# Patient Record
Sex: Female | Born: 1995 | Race: White | Hispanic: No | Marital: Married | Smoking: Never smoker
Health system: Southern US, Community
[De-identification: ages and names within clinical notes are randomized; demographics above are authoritative.]

## PROBLEM LIST (undated history)

## (undated) ENCOUNTER — Inpatient Hospital Stay: Payer: Self-pay

## (undated) DIAGNOSIS — N946 Dysmenorrhea, unspecified: Secondary | ICD-10-CM

## (undated) DIAGNOSIS — S2239XA Fracture of one rib, unspecified side, initial encounter for closed fracture: Secondary | ICD-10-CM

## (undated) DIAGNOSIS — Z803 Family history of malignant neoplasm of breast: Secondary | ICD-10-CM

## (undated) HISTORY — PX: OTHER SURGICAL HISTORY: SHX169

## (undated) HISTORY — DX: Dysmenorrhea, unspecified: N94.6

## (undated) HISTORY — DX: Family history of malignant neoplasm of breast: Z80.3

## (undated) HISTORY — PX: DILATION AND CURETTAGE OF UTERUS: SHX78

## (undated) HISTORY — DX: Fracture of one rib, unspecified side, initial encounter for closed fracture: S22.39XA

---
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
## 2016-05-26 ENCOUNTER — Encounter: Payer: Self-pay | Admitting: Certified Nurse Midwife

## 2016-05-27 ENCOUNTER — Ambulatory Visit (INDEPENDENT_AMBULATORY_CARE_PROVIDER_SITE_OTHER): Admitting: Certified Nurse Midwife

## 2016-05-27 ENCOUNTER — Encounter: Payer: Self-pay | Admitting: Certified Nurse Midwife

## 2016-05-27 VITALS — BP 102/72 | HR 85 | Ht 61.0 in | Wt 167.0 lb

## 2016-05-27 DIAGNOSIS — B9689 Other specified bacterial agents as the cause of diseases classified elsewhere: Secondary | ICD-10-CM | POA: Diagnosis not present

## 2016-05-27 DIAGNOSIS — Z124 Encounter for screening for malignant neoplasm of cervix: Secondary | ICD-10-CM | POA: Diagnosis not present

## 2016-05-27 DIAGNOSIS — N760 Acute vaginitis: Secondary | ICD-10-CM

## 2016-05-27 DIAGNOSIS — Z01419 Encounter for gynecological examination (general) (routine) without abnormal findings: Secondary | ICD-10-CM

## 2016-05-27 DIAGNOSIS — N898 Other specified noninflammatory disorders of vagina: Secondary | ICD-10-CM | POA: Diagnosis not present

## 2016-05-27 LAB — POCT WET PREP (WET MOUNT): TRICHOMONAS WET PREP HPF POC: ABSENT

## 2016-05-27 MED ORDER — TINIDAZOLE 500 MG PO TABS: 1000.0000 mg | ORAL_TABLET | Freq: Every day | ORAL | 0 refills | Status: AC

## 2016-05-27 NOTE — Progress Notes (Signed)
Gynecology Annual Exam  PCP: No PCP Per Patient  Chief Complaint:  Chief Complaint  Patient presents with  . Gynecologic Exam    History of Present Illness [name] is a 21 y.o. G1P0010 whose LMP was 05/12/2016 presents today for her annual examination.  Her menses are every 26 days and  last 3-4 days with one heavier day requiring tampon change q2-3 hours. SInce her last annual exam she has married Legrand Como who is in [place]. She will be moving to [place] soon.  She is wanting to conceive and is not currently using any contraception. She is taking a prenatal vitamin.  Last Pap: has not had one due to age   Hx of STDs: none  There is a FH of breast cancer in her mother. Her mother tested negative for BRCA1, BRCA 2 and BART. There is no FH of ovarian cancer. The patient does do self-breast exams occasionally.  Tobacco use: The patient denies current or previous tobacco use. Alcohol use: rare use Exercise: exercises most days of the week  She does not get adequate calcium and Vitamin D in her diet.  .    Review of Systems: Review of Systems  Constitutional: Negative for chills, fever and weight loss.  HENT: Negative for congestion, sinus pain and sore throat.   Eyes: Negative for blurred vision and pain.  Respiratory: Negative for hemoptysis, shortness of breath and wheezing.   Cardiovascular: Negative for chest pain, palpitations and leg swelling.  Gastrointestinal: Negative for abdominal pain, blood in stool, diarrhea, heartburn, nausea and vomiting.  Genitourinary: Negative for dysuria, frequency, hematuria and urgency.  Musculoskeletal: Negative for back pain, joint pain and myalgias.  Skin: Negative for itching and rash.  Neurological: Negative for dizziness, tingling and headaches.  Endo/Heme/Allergies: Negative for environmental allergies and polydipsia. Does not bruise/bleed easily.       Negative for hirsutism   Psychiatric/Behavioral: Negative for  depression. The patient is not nervous/anxious and does not have insomnia.      Past Medical History:  Past Medical History:  Diagnosis Date  . Dysmenorrhea   . Family history of breast cancer    mom age 5; BRCA neg  . Rib fracture 10/2008   sports injury    Past Surgical History:  Past Surgical History:  Procedure Laterality Date  . nexplanon     2015    Medications: Prenatal vitamins  Allergies:  No Known Allergies  Obstetric History: G1P0010  Social History:  Social History   Social History  . Marital status: Married    Spouse name: Legrand Como  . Number of children: 0  . Years of education: N/A   Occupational History  . Not on file.   Social History Main Topics  . Smoking status: Never Smoker  . Smokeless tobacco: Never Used  . Alcohol use Yes  . Drug use: No  . Sexual activity: Yes    Birth control/ protection: None   Other Topics Concern  . Not on file   Social History Narrative  . No narrative on file    Family History:  Family History  Problem Relation Age of Onset  . Breast cancer Mother 14    BRCA neg  . Hypertension Father   . Renal cancer Paternal Grandmother   . Diabetes Paternal Grandfather      Physical Exam Vitals: BP 102/72   Pulse 85   Ht '5\' 1"'$  (1.549 m)   Wt 75.8 kg (167 lb)   LMP 05/12/2016 (  Exact Date)   BMI 31.55 kg/m   Physical Exam  Constitutional: She is oriented to person, place, and time. She appears well-developed and well-nourished.  HENT:  Head: Normocephalic and atraumatic.  Neck: Normal range of motion. Neck supple. No thyromegaly present.  Cardiovascular: Normal rate and regular rhythm.   No murmur heard. Respiratory: Effort normal and breath sounds normal. She has no wheezes. She has no rales. She exhibits no tenderness. Right breast exhibits no inverted nipple, no mass, no nipple discharge, no skin change and no tenderness. Left breast exhibits no inverted nipple, no mass, no nipple discharge, no skin  change and no tenderness.  GI: Soft. She exhibits no distension and no mass. There is no tenderness. There is no rebound and no guarding.  Genitourinary: Rectum normal. Cervix exhibits no motion tenderness, no discharge and no friability. Right adnexum displays no mass and no tenderness. Left adnexum displays no mass and no tenderness.  Genitourinary Comments: Vulva: no lesions or inflammation Vagina: white homogenous discharge with odor Uterus: nonenlarged, normal contour Position: midplane,   mobile, non-tender Adnexa: no masses, NT   Musculoskeletal: Normal range of motion.  Lymphadenopathy:    She has no cervical adenopathy.    She has axillary adenopathy.       Right: No inguinal and no supraclavicular adenopathy present.       Left: No inguinal and no supraclavicular adenopathy present.  No infraclavicular adenopathy  Neurological: She is alert and oriented to person, place, and time.  Skin: Skin is warm and dry. No rash noted.  Psychiatric: She has a normal mood and affect. Her behavior is normal. Judgment and thought content normal.    Wet prep positive for clue cells and amine odor, negative for hyphae and Trich  Assessment: 21 y.o. G1P0010 well woman exam Bacterial vaginosis  Plan:   1. Encounter for gynecological examination   2. Screening for cervical cancer Pap done  3. Bacterial vaginosis  Rx for Tindamax 1 GM daily x 5 days. Advised to alcohol x 7 days. Take with food. 4. Desires to conceive.   Continue on prenatal vitamins  No FH of genetic problems or congenital abnormalities  Reviewed when most fertile    Dalia Heading, [place]  05/27/2016 9:44 PM

## 2016-06-01 LAB — IGP,RFX APTIMA HPV ALL PTH: PAP Smear Comment: 0

## 2016-06-11 ENCOUNTER — Encounter: Payer: Self-pay | Admitting: Obstetrics and Gynecology

## 2018-01-11 ENCOUNTER — Ambulatory Visit (INDEPENDENT_AMBULATORY_CARE_PROVIDER_SITE_OTHER): Payer: BLUE CROSS/BLUE SHIELD | Admitting: Advanced Practice Midwife

## 2018-01-11 ENCOUNTER — Encounter: Payer: Self-pay | Admitting: Advanced Practice Midwife

## 2018-01-11 ENCOUNTER — Ambulatory Visit (INDEPENDENT_AMBULATORY_CARE_PROVIDER_SITE_OTHER): Payer: BLUE CROSS/BLUE SHIELD

## 2018-01-11 VITALS — BP 120/70 | Wt 151.0 lb

## 2018-01-11 DIAGNOSIS — Z3480 Encounter for supervision of other normal pregnancy, unspecified trimester: Secondary | ICD-10-CM

## 2018-01-11 DIAGNOSIS — Z131 Encounter for screening for diabetes mellitus: Secondary | ICD-10-CM

## 2018-01-11 DIAGNOSIS — Z23 Encounter for immunization: Secondary | ICD-10-CM

## 2018-01-11 DIAGNOSIS — Z113 Encounter for screening for infections with a predominantly sexual mode of transmission: Secondary | ICD-10-CM

## 2018-01-11 DIAGNOSIS — Z363 Encounter for antenatal screening for malformations: Secondary | ICD-10-CM | POA: Diagnosis not present

## 2018-01-11 DIAGNOSIS — Z3482 Encounter for supervision of other normal pregnancy, second trimester: Secondary | ICD-10-CM

## 2018-01-11 DIAGNOSIS — Z130 Encounter for screening for diseases of the blood and blood-forming organs and certain disorders involving the immune mechanism: Secondary | ICD-10-CM

## 2018-01-11 DIAGNOSIS — Z3A23 23 weeks gestation of pregnancy: Secondary | ICD-10-CM

## 2018-01-11 NOTE — Patient Instructions (Signed)
Exercise During Pregnancy For people of all ages, exercise is an important part of being healthy. Exercise improves heart and lung function and helps to maintain strength, flexibility, and a healthy body weight. Exercise also boosts energy levels and elevates mood. For most women, maintaining an exercise routine throughout pregnancy is recommended. It is only on rare occasions and with certain medical conditions or pregnancy complications that women may be asked to limit or avoid exercise during pregnancy. What are some other benefits to exercising during pregnancy? Along with maintaining strength and flexibility, exercising throughout pregnancy can help to:  Keep strength in muscles that are very important during labor and childbirth.  Decrease low back pain during pregnancy.  Decrease the risk of developing gestational diabetes mellitus (GDM).  Improve blood sugar (glucose) control for women who have GDM.  Decrease the risk of developing preeclampsia. This is a serious condition that causes high blood pressure along with other symptoms, such as swelling and headaches.  Decrease the risk of cesarean delivery.  Speed up the recovery after giving birth.  How often should I exercise? Unless your health care provider gives you different instructions, you should try to exercise on most days or all days of the week. In general, try to exercise with moderate intensity for about 150 minutes per week. This can be spread out across several days, such as exercising for 30 minutes per day on 5 days of each week. You can tell that you are exercising at a moderate intensity if you have a higher heart rate and faster breathing, but you are still able to hold a conversation. What types of moderate-intensity exercise are recommended during pregnancy? There are many types of exercise that are safe for you to do during pregnancy. Unless your health care provider gives you different instructions, do a variety of  exercises that safely increase your heart and breathing (cardiopulmonary) rates and help you to build and maintain muscle strength (strength training). You should always be able to talk in full sentences while exercising during pregnancy. Some examples of exercising that is safe to do during pregnancy include:  Brisk walking or hiking.  Swimming.  Water aerobics.  Riding a stationary bike.  Strength training.  Modified yoga or Pilates. Tell your instructor that you are pregnant. Avoid overstretching and avoid lying on your back for long periods of time.  Running or jogging. Only choose this type of exercise if: ? You ran or jogged regularly before your pregnancy. ? You can run or jog and still talk in complete sentences.  What types of exercise should I not do during pregnancy? Depending on your level of fitness and whether you exercised regularly before your pregnancy, you may be advised to limit vigorous-intensity exercise during your pregnancy. You can tell that you are exercising at a vigorous intensity if you are breathing much harder and faster and cannot hold a conversation while exercising. Some examples of exercising that you should avoid during pregnancy include:  Contact sports.  Activities that place you at risk for falling on or being hit in the belly, such as downhill skiing, water skiing, surfing, rock climbing, cycling, gymnastics, and horseback riding.  Scuba diving.  Sky diving.  Yoga or Pilates in a room that is heated to extreme temperatures ("hot yoga" or "hot Pilates").  Jogging or running, unless you ran or jogged regularly before your pregnancy. While jogging or running, you should always be able to talk in full sentences. Do not run or jog so vigorously   that you are unable to have a conversation.  If you are not used to exercising at elevation (more than 6,000 feet above sea level), do not do so during your pregnancy.  When should I avoid exercising  during pregnancy? Certain medical conditions can make it unsafe to exercise during pregnancy, or they may increase your risk of miscarriage or early labor and birth. Some of these conditions include:  Some types of heart disease.  Some types of lung disease.  Placenta previa. This is when the placenta partially or completely covers the opening of the uterus (cervix).  Frequent bleeding from the vagina during your pregnancy.  Incompetent cervix. This is when your cervix does not remain as tightly closed during pregnancy as it should.  Premature labor.  Ruptured membranes. This is when the protective sac (amniotic sac) opens up and amniotic fluid leaks from your vagina.  Severely low blood count (anemia).  Preeclampsia or pregnancy-caused high blood pressure.  Carrying more than one baby (multiple gestation) and having an additional risk of early labor.  Poorly controlled diabetes.  Being severely underweight or severely overweight.  Intrauterine growth restriction. This is when your baby's growth and development during pregnancy are slower than expected.  Other medical conditions. Ask your health care provider if any apply to you.  What else should I know about exercising during pregnancy? You should take these precautions while exercising during pregnancy:  Avoid overheating. ? Wear loose-fitting, breathable clothes. ? Do not exercise in very high temperatures.  Avoid dehydration. Drink enough water before, during, and after exercise to keep your urine clear or pale yellow.  Avoid overstretching. Because of hormone changes during pregnancy, it is easy to overstretch muscles, tendons, and ligaments during pregnancy.  Start slowly and ask your health care provider to recommend types of exercise that are safe for you, if exercising regularly is new for you.  Pregnancy is not a time for exercising to lose weight. When should I seek medical care? You should stop exercising  and call your health care provider if you have any unusual symptoms, such as:  Mild uterine contractions or abdominal cramping.  Dizziness that does not improve with rest.  When should I seek immediate medical care? You should stop exercising and call your local emergency services (911 in the [place] if you have any unusual symptoms, such as:  Sudden, severe pain in your low back or your belly.  Uterine contractions or abdominal cramping that do not improve with rest.  Chest pain.  Bleeding or fluid leaking from your vagina.  Shortness of breath.  This information is not intended to replace advice given to you by your health care provider. Make sure you discuss any questions you have with your health care provider. Document Released: 02/09/2005 Document Revised: 07/10/2015 Document Reviewed: 04/19/2014 [employer] Interactive Patient Education  2018 [employer] Eating Plan for Pregnant Women While you are pregnant, your body will require additional nutrition to help support your growing baby. It is recommended that you consume:  150 additional calories each day during your first trimester.  300 additional calories each day during your second trimester.  300 additional calories each day during your third trimester.  Eating a healthy, well-balanced diet is very important for your health and for your baby's health. You also have a higher need for some vitamins and minerals, such as folic acid, calcium, iron, and vitamin D. What do I need to know about eating during pregnancy?  Do not try to lose weight   or go on a diet during pregnancy.  Choose healthy, nutritious foods. Choose  of a sandwich with a glass of milk instead of a candy bar or a high-calorie sugar-sweetened beverage.  Limit your overall intake of foods that have "empty calories." These are foods that have little nutritional value, such as sweets, desserts, candies, sugar-sweetened beverages, and fried foods.  Eat a  variety of foods, especially fruits and vegetables.  Take a prenatal vitamin to help meet the additional needs during pregnancy, specifically for folic acid, iron, calcium, and vitamin D.  Remember to stay active. Ask your health care provider for exercise recommendations that are specific to you.  Practice good food safety and cleanliness, such as washing your hands before you eat and after you prepare raw meat. This helps to prevent foodborne illnesses, such as listeriosis, that can be very dangerous for your baby. Ask your health care provider for more information about listeriosis. What does 150 extra calories look like? Healthy options for an additional 150 calories each day could be any of the following:  Plain low-fat yogurt (6-8 oz) with  cup of berries.  1 apple with 2 teaspoons of peanut butter.  Cut-up vegetables with  cup of hummus.  Low-fat chocolate milk (8 oz or 1 cup).  1 string cheese with 1 medium orange.   of a peanut butter and jelly sandwich on whole-wheat bread (1 tsp of peanut butter).  For 300 calories, you could eat two of those healthy options each day. What is a healthy amount of weight to gain? The recommended amount of weight for you to gain is based on your pre-pregnancy BMI. If your pre-pregnancy BMI was:  Less than 18 (underweight), you should gain 28-40 lb.  18-24.9 (normal), you should gain 25-35 lb.  25-29.9 (overweight), you should gain 15-25 lb.  Greater than 30 (obese), you should gain 11-20 lb.  What if I am having twins or multiples? Generally, pregnant women who will be having twins or multiples may need to increase their daily calories by 300-600 calories each day. The recommended range for total weight gain is 25-54 lb, depending on your pre-pregnancy BMI. Talk with your health care provider for specific guidance about additional nutritional needs, weight gain, and exercise during your pregnancy. What foods can I eat? Grains Any  grains. Try to choose whole grains, such as whole-wheat bread, oatmeal, or brown rice. Vegetables Any vegetables. Try to eat a variety of colors and types of vegetables to get a full range of vitamins and minerals. Remember to wash your vegetables well before eating. Fruits Any fruits. Try to eat a variety of colors and types of fruit to get a full range of vitamins and minerals. Remember to wash your fruits well before eating. Meats and Other Protein Sources Lean meats, including chicken, turkey, fish, and lean cuts of beef, veal, or pork. Make sure that all meats are cooked to "well done." Tofu. Tempeh. Beans. Eggs. Peanut butter and other nut butters. Seafood, such as shrimp, crab, and lobster. If you choose fish, select types that are higher in omega-3 fatty acids, including salmon, herring, mussels, trout, sardines, and pollock. Make sure that all meats are cooked to food-safe temperatures. Dairy Pasteurized milk and milk alternatives. Pasteurized yogurt and pasteurized cheese. Cottage cheese. Sour cream. Beverages Water. Juices that contain 100% fruit juice or vegetable juice. Caffeine-free teas and decaffeinated coffee. Drinks that contain caffeine are okay to drink, but it is better to avoid caffeine. Keep your total caffeine   intake to less than 200 mg each day (12 oz of coffee, tea, or soda) or as directed by your health care provider. Condiments Any pasteurized condiments. Sweets and Desserts Any sweets and desserts. Fats and Oils Any fats and oils. The items listed above may not be a complete list of recommended foods or beverages. Contact your dietitian for more options. What foods are not recommended? Vegetables Unpasteurized (raw) vegetable juices. Fruits Unpasteurized (raw) fruit juices. Meats and Other Protein Sources Cured meats that have nitrates, such as bacon, salami, and hotdogs. Luncheon meats, bologna, or other deli meats (unless they are reheated until they are  steaming hot). Refrigerated pate, meat spreads from a meat counter, smoked seafood that is found in the refrigerated section of a store. Raw fish, such as sushi or sashimi. High mercury content fish, such as tilefish, shark, swordfish, and king mackerel. Raw meats, such as tuna or beef tartare. Undercooked meats and poultry. Make sure that all meats are cooked to food-safe temperatures. Dairy Unpasteurized (raw) milk and any foods that have raw milk in them. Soft cheeses, such as feta, queso blanco, queso fresco, Brie, Camembert cheeses, blue-veined cheeses, and Panela cheese (unless it is made with pasteurized milk, which must be stated on the label). Beverages Alcohol. Sugar-sweetened beverages, such as sodas, teas, or energy drinks. Condiments Homemade fermented foods and drinks, such as pickles, sauerkraut, or kombucha drinks. (Store-bought pasteurized versions of these are okay.) Other Salads that are made in the store, such as ham salad, chicken salad, egg salad, tuna salad, and seafood salad. The items listed above may not be a complete list of foods and beverages to avoid. Contact your dietitian for more information. This information is not intended to replace advice given to you by your health care provider. Make sure you discuss any questions you have with your health care provider. Document Released: 11/24/2013 Document Revised: 07/18/2015 Document Reviewed: 07/25/2013 [employer] Interactive Patient Education  2018 [employer] Prenatal Care WHAT IS PRENATAL CARE? Prenatal care is the process of caring for a pregnant woman before she gives birth. Prenatal care makes sure that she and her baby remain as healthy as possible throughout pregnancy. Prenatal care may be provided by a midwife, family practice health care provider, or a childbirth and pregnancy specialist (obstetrician). Prenatal care may include physical examinations, testing, treatments, and education on nutrition, lifestyle, and  social support services. WHY IS PRENATAL CARE SO IMPORTANT? Early and consistent prenatal care increases the chance that you and your baby will remain healthy throughout your pregnancy. This type of care also decreases a baby's risk of being born too early (prematurely), or being born smaller than expected (small for gestational age). Any underlying medical conditions you may have that could pose a risk during your pregnancy are discussed during prenatal care visits. You will also be monitored regularly for any new conditions that may arise during your pregnancy so they can be treated quickly and effectively. WHAT HAPPENS DURING PRENATAL CARE VISITS? Prenatal care visits may include the following: Discussion Tell your health care provider about any new signs or symptoms you have experienced since your last visit. These might include:  Nausea or vomiting.  Increased or decreased level of energy.  Difficulty sleeping.  Back or leg pain.  Weight changes.  Frequent urination.  Shortness of breath with physical activity.  Changes in your skin, such as the development of a rash or itchiness.  Vaginal discharge or bleeding.  Feelings of excitement or nervousness.  Changes in   your baby's movements.  You may want to write down any questions or topics you want to discuss with your health care provider and bring them with you to your appointment. Examination During your first prenatal care visit, you will likely have a complete physical exam. Your health care provider will often examine your vagina, cervix, and the position of your uterus, as well as check your heart, lungs, and other body systems. As your pregnancy progresses, your health care provider will measure the size of your uterus and your baby's position inside your uterus. He or she may also examine you for early signs of labor. Your prenatal visits may also include checking your blood pressure and, after about 10-12 weeks of  pregnancy, listening to your baby's heartbeat. Testing Regular testing often includes:  Urinalysis. This checks your urine for glucose, protein, or signs of infection.  Blood count. This checks the levels of white and red blood cells in your body.  Tests for sexually transmitted infections (STIs). Testing for STIs at the beginning of pregnancy is routinely done and is required in many states.  Antibody testing. You will be checked to see if you are immune to certain illnesses, such as rubella, that can affect a developing fetus.  Glucose screen. Around 24-28 weeks of pregnancy, your blood glucose level will be checked for signs of gestational diabetes. Follow-up tests may be recommended.  Group B strep. This is a bacteria that is commonly found inside a woman's vagina. This test will inform your health care provider if you need an antibiotic to reduce the amount of this bacteria in your body prior to labor and childbirth.  Ultrasound. Many pregnant women undergo an ultrasound screening around 18-20 weeks of pregnancy to evaluate the health of the fetus and check for any developmental abnormalities.  HIV (human immunodeficiency virus) testing. Early in your pregnancy, you will be screened for HIV. If you are at high risk for HIV, this test may be repeated during your third trimester of pregnancy.  You may be offered other testing based on your age, personal or family medical history, or other factors. HOW OFTEN SHOULD I PLAN TO SEE MY HEALTH CARE PROVIDER FOR PRENATAL CARE? Your prenatal care check-up schedule depends on any medical conditions you have before, or develop during, your pregnancy. If you do not have any underlying medical conditions, you will likely be seen for checkups:  Monthly, during the first 6 months of pregnancy.  Twice a month during months 7 and 8 of pregnancy.  Weekly starting in the 9th month of pregnancy and until delivery.  If you develop signs of early labor  or other concerning signs or symptoms, you may need to see your health care provider more often. Ask your health care provider what prenatal care schedule is best for you. WHAT CAN I DO TO KEEP MYSELF AND MY BABY AS HEALTHY AS POSSIBLE DURING MY PREGNANCY?  Take a prenatal vitamin containing 400 micrograms (0.4 mg) of folic acid every day. Your health care provider may also ask you to take additional vitamins such as iodine, vitamin D, iron, copper, and zinc.  Take 1500-2000 mg of calcium daily starting at your 20th week of pregnancy until you deliver your baby.  Make sure you are up to date on your vaccinations. Unless directed otherwise by your health care provider: ? You should receive a tetanus, diphtheria, and pertussis (Tdap) vaccination between the 27th and 36th week of your pregnancy, regardless of when your last Tdap immunization   occurred. This helps protect your baby from whooping cough (pertussis) after he or she is born. ? You should receive an annual inactivated influenza vaccine (IIV) to help protect you and your baby from influenza. This can be done at any point during your pregnancy.  Eat a well-rounded diet that includes: ? Fresh fruits and vegetables. ? Lean proteins. ? Calcium-rich foods such as milk, yogurt, hard cheeses, and dark, leafy greens. ? Whole grain breads.  Do noteat seafood high in mercury, including: ? Swordfish. ? Tilefish. ? Shark. ? King mackerel. ? More than 6 oz tuna per week.  Do not eat: ? Raw or undercooked meats or eggs. ? Unpasteurized foods, such as soft cheeses (brie, blue, or feta), juices, and milks. ? Lunch meats. ? Hot dogs that have not been heated until they are steaming.  Drink enough water to keep your urine clear or pale yellow. For many women, this may be 10 or more 8 oz glasses of water each day. Keeping yourself hydrated helps deliver nutrients to your baby and may prevent the start of pre-term uterine contractions.  Do not use  any tobacco products including cigarettes, chewing tobacco, or electronic cigarettes. If you need help quitting, ask your health care provider.  Do not drink beverages containing alcohol. No safe level of alcohol consumption during pregnancy has been determined.  Do not use any illegal drugs. These can harm your developing baby or cause a miscarriage.  Ask your health care provider or pharmacist before taking any prescription or over-the-counter medicines, herbs, or supplements.  Limit your caffeine intake to no more than 200 mg per day.  Exercise. Unless told otherwise by your health care provider, try to get 30 minutes of moderate exercise most days of the week. Do not  do high-impact activities, contact sports, or activities with a high risk of falling, such as horseback riding or downhill skiing.  Get plenty of rest.  Avoid anything that raises your body temperature, such as hot tubs and saunas.  If you own a cat, do not empty its litter box. Bacteria contained in cat feces can cause an infection called toxoplasmosis. This can result in serious harm to the fetus.  Stay away from chemicals such as insecticides, lead, mercury, and cleaning or paint products that contain solvents.  Do not have any X-rays taken unless medically necessary.  Take a childbirth and breastfeeding preparation class. Ask your health care provider if you need a referral or recommendation.  This information is not intended to replace advice given to you by your health care provider. Make sure you discuss any questions you have with your health care provider. Document Released: 02/12/2003 Document Revised: 07/15/2015 Document Reviewed: 04/26/2013 [employer] Interactive Patient Education  2017 [employer]  

## 2018-01-11 NOTE — Progress Notes (Addendum)
New Obstetric Patient H&P    Chief Complaint: Transfer of prenatal care from [facility] [address] [place], [place]   History of Present Illness: Patient is a 22 y.o. [id number] Not Hispanic or Latino female, presents with amenorrhea and positive home pregnancy test. Patient's last menstrual period was 07/28/2017 (exact date). and based on her LMP, her EDD is Estimated Date of Delivery: 05/04/18 and her EGA is [redacted]w[redacted]d Cycles are 4. days, regular, and occur approximately every : 28 days. Her last pap smear was 3 months ago and was no abnormalities.    She had a urine pregnancy test which was positive 4 month(s)  ago. Her last menstrual period was normal and lasted for  3 or 4 day(s). Since her LMP she claims she has experienced breast tenderness. She denies vaginal bleeding. Her past medical history is noncontributory. Her prior pregnancies are notable for TAB, SAB  Since her LMP, she admits to the use of tobacco products  no She claims she initially lost 15 pounds since the start of her pregnancy and then regained to within 6 pounds of her pre-pregnancy weight.  There are cats in the home in the home  no  She admits close contact with children on a regular basis  no  She has had chicken pox in the past yes She has had Tuberculosis exposures, symptoms, or previously tested positive for TB   no Current or past history of domestic violence. no  Genetic Screening/Teratology Counseling: (Includes patient, baby's father, or anyone in either family with:)   [facility] 2. Thalassemia ([place] or Asian background): MCV<80  no 3. Neural tube defect (meningomyelocele, spina bifida, anencephaly)  no 4. Congenital heart defect  no  5. Down syndrome  Mom's cousin with DS 6. Tay-Sachs (Jewish, [place]  no 7. Canavan's Disease  no 8. Sickle cell disease or trait (African)  no  9. Hemophilia or other blood disorders  no  10. Muscular dystrophy  no  11. Cystic  fibrosis  no  12. Huntington's Chorea  no  13. Mental retardation/autism  no 14. Other inherited genetic or chromosomal disorder  no 15. Maternal metabolic disorder (DM, PKU, etc)  no 16. Patient or FOB with a child with a birth defect not listed above no  16a. Patient or FOB with a birth defect themselves no 17. Recurrent pregnancy loss, or stillbirth  no  18. Any medications since LMP other than prenatal vitamins (include vitamins, supplements, OTC meds, drugs, alcohol)  no 19. Any other genetic/environmental exposure to discuss  no  Infection History:   1. Lives with someone with TB or TB exposed  no  2. Patient or partner has history of genital herpes  no 3. Rash or viral illness since LMP  no 4. History of STI (GC, CT, HPV, syphilis, HIV)  no 5. History of recent travel :  no  Other pertinent information: Has not had anatomy scan yet due to traveling/moving and will have anatomy scan today    Review of Systems:10 point review of systems negative unless otherwise noted in HPI  Past Medical History:  Past Medical History:  Diagnosis Date  . Dysmenorrhea   . Family history of breast cancer    mom age 23 BRCA neg; pt qualifies for update testing when older  . Rib fracture 10/2008   sports injury    Past Surgical History:  Past Surgical History:  Procedure Laterality Date  . DILATION AND  CURETTAGE OF UTERUS    . nexplanon     2015    Gynecologic History: Patient's last menstrual period was 07/28/2017 (exact date).  Obstetric History: G3P0020  Family History:  Family History  Problem Relation Age of Onset  . Breast cancer Mother 38       BRCA neg  . Hypertension Father   . Kidney Stones Father   . Renal cancer Paternal Grandmother   . Diabetes Paternal Grandfather   . Stroke Maternal Grandfather     Social History:  Social History   Socioeconomic History  . Marital status: Married    Spouse name: Legrand Como  . Number of children: 0  . Years of education:  Not on file  . Highest education level: Not on file  Occupational History  . Not on file  Social Needs  . Financial resource strain: Not on file  . Food insecurity:    Worry: Not on file    Inability: Not on file  . Transportation needs:    Medical: Not on file    Non-medical: Not on file  Tobacco Use  . Smoking status: Never Smoker  . Smokeless tobacco: Never Used  Substance and Sexual Activity  . Alcohol use: Yes  . Drug use: No  . Sexual activity: Yes    Birth control/protection: None  Lifestyle  . Physical activity:    Days per week: Not on file    Minutes per session: Not on file  . Stress: Not on file  Relationships  . Social connections:    Talks on phone: Not on file    Gets together: Not on file    Attends religious service: Not on file    Active member of club or organization: Not on file    Attends meetings of clubs or organizations: Not on file    Relationship status: Not on file  . Intimate partner violence:    Fear of current or ex partner: Not on file    Emotionally abused: Not on file    Physically abused: Not on file    Forced sexual activity: Not on file  Other Topics Concern  . Not on file  Social History Narrative  . Not on file    Allergies:  No Known Allergies  Medications: Prior to Admission medications   Medication Sig Start Date End Date Taking? Authorizing Provider  Prenatal Vit-Fe Fumarate-FA (MULTIVITAMIN-PRENATAL) 27-0.8 MG TABS tablet Take 1 tablet by mouth daily at 12 noon.   Yes [provider]    Physical Exam Vitals: Blood pressure 120/70, weight 151 lb (68.5 kg), last menstrual period 07/28/2017.  General: NAD HEENT: normocephalic, anicteric Thyroid: no enlargement, no palpable nodules Pulmonary: No increased work of breathing, CTAB Cardiovascular: RRR, distal pulses 2+ Abdomen: NABS, soft, non-tender, non-distended.  Umbilicus without lesions.  No hepatomegaly, splenomegaly or masses palpable. No evidence of  hernia. FHTs 136  Genitourinary: deferred for no concerns, PAP interval Extremities: no edema, erythema, or tenderness Neurologic: Grossly intact Psychiatric: mood appropriate, affect full  Anatomy scan is complete and normal/female   Assessment: 22 y.o. G3P0020 at 77w6dpresenting to initiate prenatal care  Plan: 1) Avoid alcoholic beverages. 2) Patient encouraged not to smoke.  3) Discontinue the use of all non-medicinal drugs and chemicals.  4) Take prenatal vitamins daily.  5) Nutrition, food safety (fish, cheese advisories, and high nitrite foods) and exercise discussed. 6) Hospital and practice style discussed with cross coverage system.  7) Genetic Screening, such as with 1st  Trimester Screening, cell free fetal DNA, AFP testing, and Ultrasound, as well as with amniocentesis and CVS as appropriate, is discussed with patient. She has already had genetic screening 8) Patient is asked about travel to areas at risk for the [place] virus, and counseled to avoid travel and exposure to mosquitoes or sexual partners who may have themselves been exposed to the virus. Testing is discussed, and will be ordered as appropriate.  9) Anatomy scan later this afternoon 10) Return in 4 weeks for 28 week labs and rob   Rod Can, [facility] 01/11/2018, 2:27 PM

## 2018-02-07 ENCOUNTER — Other Ambulatory Visit: Payer: Self-pay

## 2018-02-07 ENCOUNTER — Observation Stay
Admission: EM | Admit: 2018-02-07 | Discharge: 2018-02-07 | Disposition: A | Discharge status: Home / Self Care | Payer: Medicaid Other | Attending: Obstetrics & Gynecology | Admitting: Obstetrics & Gynecology

## 2018-02-07 DIAGNOSIS — Z3A27 27 weeks gestation of pregnancy: Secondary | ICD-10-CM | POA: Diagnosis not present

## 2018-02-07 DIAGNOSIS — O212 Late vomiting of pregnancy: Principal | ICD-10-CM | POA: Insufficient documentation

## 2018-02-07 DIAGNOSIS — O219 Vomiting of pregnancy, unspecified: Secondary | ICD-10-CM | POA: Diagnosis present

## 2018-02-07 MED ORDER — PROMETHAZINE HCL 25 MG PO TABS
25.0000 mg | ORAL_TABLET | Freq: Once | ORAL | Status: AC
  Administered 2018-02-07: 25 mg via ORAL
  Filled 2018-02-07: qty 1

## 2018-02-07 MED ORDER — PROMETHAZINE HCL 25 MG PO TABS: 25.0000 mg | ORAL_TABLET | Freq: Four times a day (QID) | ORAL | 2 refills | Status: DC | PRN

## 2018-02-07 NOTE — OB Triage Note (Signed)
Pt arrived to unit with c/o Emesis since last night 02/06/18. Last emesis was around 1230. Currently able to hold down a water, sprite, crackers. Pt states she feels "flush" and wants to be "checked out". Denies bleeding, LOF, or contractions. +FM. No other concerns.

## 2018-02-07 NOTE — Discharge Instructions (Signed)
Promethazine tablets What is this medicine? PROMETHAZINE (proe METH a zeen) is an antihistamine. It is used to treat allergic reactions and to treat or prevent nausea and vomiting from illness or motion sickness. It is also used to make you sleep before surgery, and to help treat pain or nausea after surgery. This medicine may be used for other purposes; ask your health care provider or pharmacist if you have questions. COMMON BRAND NAME(S): Phenergan What should I tell my health care provider before I take this medicine? They need to know if you have any of these conditions: -glaucoma -high blood pressure or heart disease -kidney disease -liver disease -lung or breathing disease, like asthma -prostate trouble -pain or difficulty passing urine -seizures -an unusual or allergic reaction to promethazine or phenothiazines, other medicines, foods, dyes, or preservatives -pregnant or trying to get pregnant -breast-feeding How should I use this medicine? Take this medicine by mouth with a glass of water. Follow the directions on the prescription label. Take your doses at regular intervals. Do not take your medicine more often than directed. Talk to your pediatrician regarding the use of this medicine in children. Special care may be needed. This medicine should not be given to infants and children younger than 2 years old. Overdosage: If you think you have taken too much of this medicine contact a poison control center or emergency room at once. NOTE: This medicine is only for you. Do not share this medicine with others. What if I miss a dose? If you miss a dose, take it as soon as you can. If it is almost time for your next dose, take only that dose. Do not take double or extra doses. What may interact with this medicine? Do not take this medicine with any of the following medications: -cisapride -dofetilide -dronedarone -MAOIs like Carbex, Eldepryl, Marplan, Nardil,  Parnate -pimozide -quinidine, including dextromethorphan; quinidine -thioridazine -ziprasidone This medicine may also interact with the following medications: -certain medicines for depression, anxiety, or psychotic disturbances -certain medicines for anxiety or sleep -certain medicines for seizures like carbamazepine, phenobarbital, phenytoin -certain medicines for movement abnormalities as in Parkinson's disease, or for gastrointestinal problems -epinephrine -medicines for allergies or colds -muscle relaxants -narcotic medicines for pain -other medicines that prolong the QT interval (cause an abnormal heart rhythm) -tramadol -trimethobenzamide This list may not describe all possible interactions. Give your health care provider a list of all the medicines, herbs, non-prescription drugs, or dietary supplements you use. Also tell them if you smoke, drink alcohol, or use illegal drugs. Some items may interact with your medicine. What should I watch for while using this medicine? Tell your doctor or health care professional if your symptoms do not start to get better in 1 to 2 days. You may get drowsy or dizzy. Do not drive, use machinery, or do anything that needs mental alertness until you know how this medicine affects you. To reduce the risk of dizzy or fainting spells, do not stand or sit up quickly, especially if you are an older patient. Alcohol may increase dizziness and drowsiness. Avoid alcoholic drinks. Your mouth may get dry. Chewing sugarless gum or sucking hard candy, and drinking plenty of water may help. Contact your doctor if the problem does not go away or is severe. This medicine may cause dry eyes and blurred vision. If you wear contact lenses you may feel some discomfort. Lubricating drops may help. See your eye doctor if the problem does not go away or is severe. This   medicine can make you more sensitive to the sun. Keep out of the sun. If you cannot avoid being in the sun,  wear protective clothing and use sunscreen. Do not use sun lamps or tanning beds/booths. If you are diabetic, check your blood-sugar levels regularly. What side effects may I notice from receiving this medicine? Side effects that you should report to your doctor or health care professional as soon as possible: -blurred vision -irregular heartbeat, palpitations or chest pain -muscle or facial twitches -pain or difficulty passing urine -seizures -skin rash -slowed or shallow breathing -unusual bleeding or bruising -yellowing of the eyes or skin Side effects that usually do not require medical attention (report to your doctor or health care professional if they continue or are bothersome): -headache -nightmares, agitation, nervousness, excitability, not able to sleep (these are more likely in children) -stuffy nose This list may not describe all possible side effects. Call your doctor for medical advice about side effects. You may report side effects to FDA at 1-800-FDA-1088. Where should I keep my medicine? Keep out of the reach of children. Store at room temperature, between 20 and 25 degrees C (68 and 77 degrees F). Protect from light. Throw away any unused medicine after the expiration date. NOTE: This sheet is a summary. It may not cover all possible information. If you have questions about this medicine, talk to your doctor, pharmacist, or health care provider.  2018 Elsevier/Gold Standard (2012-10-11 15:04:46)  

## 2018-02-08 ENCOUNTER — Encounter: Payer: BLUE CROSS/BLUE SHIELD | Admitting: Obstetrics & Gynecology

## 2018-02-08 ENCOUNTER — Other Ambulatory Visit: Payer: BLUE CROSS/BLUE SHIELD

## 2018-02-08 NOTE — Discharge Summary (Signed)
See Final Progress Note 02/07/2018.

## 2018-02-08 NOTE — Final Progress Note (Signed)
Physician Final Progress Note  Patient ID: Taylor Lucas MRN: [id number] DOB/AGE: 1995-12-23 22 y.o.  Admit date: 02/07/2018 Admitting provider: Gae Dry, MD Discharge date: 02/07/2018  Admission Diagnoses: Vomiting affecting pregnancy, antepartum  Discharge Diagnoses:  Nausea affecting pregnancy, antepartum  History of Present Illness: The patient is a 22 y.o. female G3P0020 at 50w6dwho presents for emesis which began on 12/15 and lasted until 1230 today. She had one episode of diarrhea during this time frame. She does not think that she ate any spoiled foods. She denies abdominal pain and fever. She has been able to keep down liquids since this evening and also a few saltine crackers. She was concerned that she may have an infection requiring medication. She does have some ongoing nausea. No vaginal bleeding or loss of fluid. Denies contractions. Baby is moving well.  Review of Systems: Review of systems negative unless otherwise noted in HPI.   Past Medical History:  Diagnosis Date  . Dysmenorrhea   . Family history of breast cancer    mom age 23 BRCA neg; pt qualifies for update testing when older  . Rib fracture 10/2008   sports injury    Past Surgical History:  Procedure Laterality Date  . DILATION AND CURETTAGE OF UTERUS    . nexplanon     2015    No current facility-administered medications on file prior to encounter.    Current Outpatient Medications on File Prior to Encounter  Medication Sig Dispense Refill  . Prenatal Vit-Fe Fumarate-FA (MULTIVITAMIN-PRENATAL) 27-0.8 MG TABS tablet Take 1 tablet by mouth daily at 12 noon.      No Known Allergies  Social History   Socioeconomic History  . Marital status: Married    Spouse name: MLegrand Como . Number of children: 0  . Years of education: Not on file  . Highest education level: Not on file  Occupational History  . Not on file  Social Needs  . Financial resource strain: Not on file  . Food  insecurity:    Worry: Not on file    Inability: Not on file  . Transportation needs:    Medical: Not on file    Non-medical: Not on file  Tobacco Use  . Smoking status: Never Smoker  . Smokeless tobacco: Never Used  Substance and Sexual Activity  . Alcohol use: Yes  . Drug use: No  . Sexual activity: Yes    Birth control/protection: None  Lifestyle  . Physical activity:    Days per week: Not on file    Minutes per session: Not on file  . Stress: Not on file  Relationships  . Social connections:    Talks on phone: Not on file    Gets together: Not on file    Attends religious service: Not on file    Active member of club or organization: Not on file    Attends meetings of clubs or organizations: Not on file    Relationship status: Not on file  . Intimate partner violence:    Fear of current or ex partner: Not on file    Emotionally abused: Not on file    Physically abused: Not on file    Forced sexual activity: Not on file  Other Topics Concern  . Not on file  Social History Narrative  . Not on file    Family history: Breast cancer in her mother, hypertension in her father, stroke in her maternal grandfather, renal cancer in her paternal grandmother, and  diabetes in her paternal grandfather      Physical Exam: BP 125/72 (BP Location: Left Arm)   Pulse (!) 122   Temp 98.3 F (36.8 C) (Oral)   Resp 18   Ht 5' 0.5" (1.537 m)   Wt 67.1 kg   LMP 07/28/2017 (Exact Date)   BMI 28.43 kg/m   Gen: NAD CV: Tachycardia Pulm: No invreased work of breathing Pelvic: Deferred Ext: No edema, erythema or tenderness  FHT: 155 bpm via Doppler  Consults: None  Significant Findings/ Diagnostic Studies: N/A  Procedures: None  Discharge Condition: stable  Disposition:  Discharge home  Diet: Clear liquid diet, advance as tolerated  Discharge Activity: Activity as tolerated  Discharge Instructions    Discharge activity:  No Restrictions   Complete by:  As directed     Discharge diet:  No restrictions   Complete by:  As directed    No sexual activity restrictions   Complete by:  As directed    Notify physician for a general feeling that "something is not right"   Complete by:  As directed    Notify physician for increase or change in vaginal discharge   Complete by:  As directed    Notify physician for intestinal cramps, with or without diarrhea, sometimes described as "gas pain"   Complete by:  As directed    Notify physician for leaking of fluid   Complete by:  As directed    Notify physician for low, dull backache, unrelieved by heat or Tylenol   Complete by:  As directed    Notify physician for menstrual like cramps   Complete by:  As directed    Notify physician for pelvic pressure   Complete by:  As directed    Notify physician for uterine contractions.  These may be painless and feel like the uterus is tightening or the baby is  "balling up"   Complete by:  As directed    Notify physician for vaginal bleeding   Complete by:  As directed    PRETERM LABOR:  Includes any of the follwing symptoms that occur between 20 - [redacted] weeks gestation.  If these symptoms are not stopped, preterm labor can result in preterm delivery, placing your baby at risk   Complete by:  As directed      Allergies as of 02/07/2018   No Known Allergies     Medication List    TAKE these medications   multivitamin-prenatal 27-0.8 MG Tabs tablet Take 1 tablet by mouth daily at 12 noon.   promethazine 25 MG tablet Commonly known as:  PHENERGAN Take 1 tablet (25 mg total) by mouth every 6 (six) hours as needed for nausea or vomiting.      Discussed that origin of vomiting is likely viral gastroenteritis. She is able to tolerate liquids and bland solids. Advised BRAT diet and sips of liquids. Antiemetic Rx given and rest encouraged. Return to triage if vomiting does not abate or unable to keep down liquids.  Signed: Rexene Agent, CNM  02/07/2018

## 2018-02-14 ENCOUNTER — Other Ambulatory Visit: Payer: BLUE CROSS/BLUE SHIELD

## 2018-02-14 ENCOUNTER — Ambulatory Visit (INDEPENDENT_AMBULATORY_CARE_PROVIDER_SITE_OTHER): Payer: BLUE CROSS/BLUE SHIELD | Admitting: Certified Nurse Midwife

## 2018-02-14 VITALS — BP 110/64 | Wt 151.5 lb

## 2018-02-14 DIAGNOSIS — Z3480 Encounter for supervision of other normal pregnancy, unspecified trimester: Secondary | ICD-10-CM | POA: Diagnosis not present

## 2018-02-14 DIAGNOSIS — Z113 Encounter for screening for infections with a predominantly sexual mode of transmission: Secondary | ICD-10-CM

## 2018-02-14 DIAGNOSIS — Z131 Encounter for screening for diabetes mellitus: Secondary | ICD-10-CM

## 2018-02-14 DIAGNOSIS — Z3A28 28 weeks gestation of pregnancy: Secondary | ICD-10-CM

## 2018-02-14 DIAGNOSIS — Z130 Encounter for screening for diseases of the blood and blood-forming organs and certain disorders involving the immune mechanism: Secondary | ICD-10-CM | POA: Diagnosis not present

## 2018-02-14 DIAGNOSIS — Z3483 Encounter for supervision of other normal pregnancy, third trimester: Secondary | ICD-10-CM

## 2018-02-14 LAB — POCT URINALYSIS DIPSTICK OB
Glucose, UA: NEGATIVE
POC,PROTEIN,UA: NEGATIVE

## 2018-02-14 NOTE — Progress Notes (Signed)
ROB at 28wk 5d: Doing well. Baby active. 28 week labs today. O POS Breast feeding/ signed up for childbirth classes FH 28 cm/ FHT WNL ROB in 2 week Having patient request records for genetic screening results from [address] [place] [facility] next visit

## 2018-02-14 NOTE — Progress Notes (Signed)
ROB/GTT No concerns   

## 2018-02-15 LAB — 28 WEEK RH+PANEL
Basophils Absolute: 0 10*3/uL (ref 0.0–0.2)
Basos: 0 %
EOS (ABSOLUTE): 0.1 10*3/uL (ref 0.0–0.4)
Eos: 1 %
Gestational Diabetes Screen: 102 mg/dL (ref 65–139)
HIV Screen 4th Generation wRfx: NONREACTIVE
Hematocrit: 38.3 % (ref 34.0–46.6)
Hemoglobin: 12.9 g/dL (ref 11.1–15.9)
IMMATURE GRANULOCYTES: 0 %
Immature Grans (Abs): 0 10*3/uL (ref 0.0–0.1)
Lymphocytes Absolute: 0.9 10*3/uL (ref 0.7–3.1)
Lymphs: 10 %
MCH: 30.6 pg (ref 26.6–33.0)
MCHC: 33.7 g/dL (ref 31.5–35.7)
MCV: 91 fL (ref 79–97)
Monocytes Absolute: 0.6 10*3/uL (ref 0.1–0.9)
Monocytes: 6 %
Neutrophils Absolute: 7.5 10*3/uL — ABNORMAL HIGH (ref 1.4–7.0)
Neutrophils: 83 %
Platelets: 198 10*3/uL (ref 150–450)
RBC: 4.22 x10E6/uL (ref 3.77–5.28)
RDW: 12 % — ABNORMAL LOW (ref 12.3–15.4)
RPR: NONREACTIVE
WBC: 9.2 10*3/uL (ref 3.4–10.8)

## 2018-02-23 NOTE — L&D Delivery Note (Signed)
Delivery Note At 5:32 PM a viable female was delivered via Vaginal, Spontaneous (Presentation: ROA).  APGAR: 9, 9; weight pending.   Placenta status: spontaneous, intact  Cord:   3VC loose nuchal x 2 reduced on perineum without complications: .  Cord pH: N/A  Anesthesia: Epidural Episiotomy: None Lacerations: 2nd degree Suture Repair: 2-0 Vicryl bulbocavernosus, 3-0 Monocryl for skin Est. Blood Loss (mL):   Mom to postpartum.  Baby to Couplet care / Skin to Skin.  Vena Austria 04/28/2018, 6:10 PM

## 2018-02-28 ENCOUNTER — Ambulatory Visit (INDEPENDENT_AMBULATORY_CARE_PROVIDER_SITE_OTHER): Payer: BLUE CROSS/BLUE SHIELD | Admitting: Maternal Newborn

## 2018-02-28 ENCOUNTER — Encounter: Payer: Self-pay | Admitting: Maternal Newborn

## 2018-02-28 VITALS — BP 100/60 | Wt 150.0 lb

## 2018-02-28 DIAGNOSIS — Z3A30 30 weeks gestation of pregnancy: Secondary | ICD-10-CM

## 2018-02-28 DIAGNOSIS — Z3480 Encounter for supervision of other normal pregnancy, unspecified trimester: Secondary | ICD-10-CM

## 2018-02-28 DIAGNOSIS — Z23 Encounter for immunization: Secondary | ICD-10-CM | POA: Diagnosis not present

## 2018-02-28 DIAGNOSIS — Z3483 Encounter for supervision of other normal pregnancy, third trimester: Secondary | ICD-10-CM

## 2018-02-28 LAB — POCT URINALYSIS DIPSTICK OB
Glucose, UA: NEGATIVE
POC,PROTEIN,UA: NEGATIVE

## 2018-02-28 MED ORDER — TETANUS-DIPHTH-ACELL PERTUSSIS 5-2.5-18.5 LF-MCG/0.5 IM SUSP
0.5000 mL | Freq: Once | INTRAMUSCULAR | Status: AC
  Administered 2018-02-28: 0.5 mL via INTRAMUSCULAR

## 2018-02-28 NOTE — Patient Instructions (Signed)
Third Trimester of Pregnancy The third trimester is from week 28 through week 40 (months 7 through 9). The third trimester is a time when the unborn baby (fetus) is growing rapidly. At the end of the ninth month, the fetus is about 20 inches in length and weighs 6-10 pounds. Body changes during your third trimester Your body will continue to go through many changes during pregnancy. The changes vary from woman to woman. During the third trimester:  Your weight will continue to increase. You can expect to gain 25-35 pounds (11-16 kg) by the end of the pregnancy.  You may begin to get stretch marks on your hips, abdomen, and breasts.  You may urinate more often because the fetus is moving lower into your pelvis and pressing on your bladder.  You may develop or continue to have heartburn. This is caused by increased hormones that slow down muscles in the digestive tract.  You may develop or continue to have constipation because increased hormones slow digestion and cause the muscles that push waste through your intestines to relax.  You may develop hemorrhoids. These are swollen veins (varicose veins) in the rectum that can itch or be painful.  You may develop swollen, bulging veins (varicose veins) in your legs.  You may have increased body aches in the pelvis, back, or thighs. This is due to weight gain and increased hormones that are relaxing your joints.  You may have changes in your hair. These can include thickening of your hair, rapid growth, and changes in texture. Some women also have hair loss during or after pregnancy, or hair that feels dry or thin. Your hair will most likely return to normal after your baby is born.  Your breasts will continue to grow and they will continue to become tender. A yellow fluid (colostrum) may leak from your breasts. This is the first milk you are producing for your baby.  Your belly button may stick out.  You may notice more swelling in your hands,  face, or ankles.  You may have increased tingling or numbness in your hands, arms, and legs. The skin on your belly may also feel numb.  You may feel short of breath because of your expanding uterus.  You may have more problems sleeping. This can be caused by the size of your belly, increased need to urinate, and an increase in your body's metabolism.  You may notice the fetus "dropping," or moving lower in your abdomen (lightening).  You may have increased vaginal discharge.  You may notice your joints feel loose and you may have pain around your pelvic bone. What to expect at prenatal visits You will have prenatal exams every 2 weeks until week 36. Then you will have weekly prenatal exams. During a routine prenatal visit:  You will be weighed to make sure you and the baby are growing normally.  Your blood pressure will be taken.  Your abdomen will be measured to track your baby's growth.  The fetal heartbeat will be listened to.  Any test results from the previous visit will be discussed.  You may have a cervical check near your due date to see if your cervix has softened or thinned (effaced).  You will be tested for Group B streptococcus. This happens between 35 and 37 weeks. Your health care provider may ask you:  What your birth plan is.  How you are feeling.  If you are feeling the baby move.  If you have had any abnormal   symptoms, such as leaking fluid, bleeding, severe headaches, or abdominal cramping.  If you are using any tobacco products, including cigarettes, chewing tobacco, and electronic cigarettes.  If you have any questions. Other tests or screenings that may be performed during your third trimester include:  Blood tests that check for low iron levels (anemia).  Fetal testing to check the health, activity level, and growth of the fetus. Testing is done if you have certain medical conditions or if there are problems during the pregnancy.  Nonstress test  (NST). This test checks the health of your baby to make sure there are no signs of problems, such as the baby not getting enough oxygen. During this test, a belt is placed around your belly. The baby is made to move, and its heart rate is monitored during movement. What is false labor? False labor is a condition in which you feel small, irregular tightenings of the muscles in the womb (contractions) that usually go away with rest, changing position, or drinking water. These are called Braxton Hicks contractions. Contractions may last for hours, days, or even weeks before true labor sets in. If contractions come at regular intervals, become more frequent, increase in intensity, or become painful, you should see your health care provider. What are the signs of labor?  Abdominal cramps.  Regular contractions that start at 10 minutes apart and become stronger and more frequent with time.  Contractions that start on the top of the uterus and spread down to the lower abdomen and back.  Increased pelvic pressure and dull back pain.  A watery or bloody mucus discharge that comes from the vagina.  Leaking of amniotic fluid. This is also known as your "water breaking." It could be a slow trickle or a gush. Let your health care provider know if it has a color or strange odor. If you have any of these signs, call your health care provider right away, even if it is before your due date. Follow these instructions at home: Medicines  Follow your health care provider's instructions regarding medicine use. Specific medicines may be either safe or unsafe to take during pregnancy.  Take a prenatal vitamin that contains at least 600 micrograms (mcg) of folic acid.  If you develop constipation, try taking a stool softener if your health care provider approves. Eating and drinking   Eat a balanced diet that includes fresh fruits and vegetables, whole grains, good sources of protein such as meat, eggs, or tofu,  and low-fat dairy. Your health care provider will help you determine the amount of weight gain that is right for you.  Avoid raw meat and uncooked cheese. These carry germs that can cause birth defects in the baby.  If you have low calcium intake from food, talk to your health care provider about whether you should take a daily calcium supplement.  Eat four or five small meals rather than three large meals a day.  Limit foods that are high in fat and processed sugars, such as fried and sweet foods.  To prevent constipation: ? Drink enough fluid to keep your urine clear or pale yellow. ? Eat foods that are high in fiber, such as fresh fruits and vegetables, whole grains, and beans. Activity  Exercise only as directed by your health care provider. Most women can continue their usual exercise routine during pregnancy. Try to exercise for 30 minutes at least 5 days a week. Stop exercising if you experience uterine contractions.  Avoid heavy lifting.  Do   not exercise in extreme heat or humidity, or at high altitudes.  Wear low-heel, comfortable shoes.  Practice good posture.  You may continue to have sex unless your health care provider tells you otherwise. Relieving pain and discomfort  Take frequent breaks and rest with your legs elevated if you have leg cramps or low back pain.  Take warm sitz baths to soothe any pain or discomfort caused by hemorrhoids. Use hemorrhoid cream if your health care provider approves.  Wear a good support bra to prevent discomfort from breast tenderness.  If you develop varicose veins: ? Wear support pantyhose or compression stockings as told by your healthcare provider. ? Elevate your feet for 15 minutes, 3-4 times a day. Prenatal care  Write down your questions. Take them to your prenatal visits.  Keep all your prenatal visits as told by your health care provider. This is important. Safety  Wear your seat belt at all times when driving.  Make  a list of emergency phone numbers, including numbers for family, friends, the hospital, and police and fire departments. General instructions  Avoid cat litter boxes and soil used by cats. These carry germs that can cause birth defects in the baby. If you have a cat, ask someone to clean the litter box for you.  Do not travel far distances unless it is absolutely necessary and only with the approval of your health care provider.  Do not use hot tubs, steam rooms, or saunas.  Do not drink alcohol.  Do not use any products that contain nicotine or tobacco, such as cigarettes and e-cigarettes. If you need help quitting, ask your health care provider.  Do not use any medicinal herbs or unprescribed drugs. These chemicals affect the formation and growth of the baby.  Do not douche or use tampons or scented sanitary pads.  Do not cross your legs for long periods of time.  To prepare for the arrival of your baby: ? Take prenatal classes to understand, practice, and ask questions about labor and delivery. ? Make a trial run to the hospital. ? Visit the hospital and tour the maternity area. ? Arrange for maternity or paternity leave through employers. ? Arrange for family and friends to take care of pets while you are in the hospital. ? Purchase a rear-facing car seat and make sure you know how to install it in your car. ? Pack your hospital bag. ? Prepare the baby's nursery. Make sure to remove all pillows and stuffed animals from the baby's crib to prevent suffocation.  Visit your dentist if you have not gone during your pregnancy. Use a soft toothbrush to brush your teeth and be gentle when you floss. Contact a health care provider if:  You are unsure if you are in labor or if your water has broken.  You become dizzy.  You have mild pelvic cramps, pelvic pressure, or nagging pain in your abdominal area.  You have lower back pain.  You have persistent nausea, vomiting, or  diarrhea.  You have an unusual or bad smelling vaginal discharge.  You have pain when you urinate. Get help right away if:  Your water breaks before 37 weeks.  You have regular contractions less than 5 minutes apart before 37 weeks.  You have a fever.  You are leaking fluid from your vagina.  You have spotting or bleeding from your vagina.  You have severe abdominal pain or cramping.  You have rapid weight loss or weight gain.  You have   shortness of breath with chest pain.  You notice sudden or extreme swelling of your face, hands, ankles, feet, or legs.  Your baby makes fewer than 10 movements in 2 hours.  You have severe headaches that do not go away when you take medicine.  You have vision changes. Summary  The third trimester is from week 28 through week 40, months 7 through 9. The third trimester is a time when the unborn baby (fetus) is growing rapidly.  During the third trimester, your discomfort may increase as you and your baby continue to gain weight. You may have abdominal, leg, and back pain, sleeping problems, and an increased need to urinate.  During the third trimester your breasts will keep growing and they will continue to become tender. A yellow fluid (colostrum) may leak from your breasts. This is the first milk you are producing for your baby.  False labor is a condition in which you feel small, irregular tightenings of the muscles in the womb (contractions) that eventually go away. These are called Braxton Hicks contractions. Contractions may last for hours, days, or even weeks before true labor sets in.  Signs of labor can include: abdominal cramps; regular contractions that start at 10 minutes apart and become stronger and more frequent with time; watery or bloody mucus discharge that comes from the vagina; increased pelvic pressure and dull back pain; and leaking of amniotic fluid. This information is not intended to replace advice given to you by your  health care provider. Make sure you discuss any questions you have with your health care provider. Document Released: 02/03/2001 Document Revised: 03/17/2016 Document Reviewed: 03/17/2016 Elsevier Interactive Patient Education  2019 [employer]  

## 2018-02-28 NOTE — Progress Notes (Signed)
    Routine Prenatal Care Visit  Subjective  Taylor Lucas is a 23 y.o. G3P0020 at [redacted]w[redacted]d being seen today for ongoing prenatal care.  She is currently monitored for the following issues for this low-risk pregnancy and has Encounter for gynecological examination; Screening for cervical cancer; Supervision of other normal pregnancy, antepartum; and Vomiting affecting pregnancy, antepartum on their problem list.  ----------------------------------------------------------------------------------- Patient reports URI symptoms: nasal congestion, cough Contractions: Not present. Vag. Bleeding: None.  Movement: Present. No leaking of fluid.  ----------------------------------------------------------------------------------- The following portions of the patient's history were reviewed and updated as appropriate: allergies, current medications, past family history, past medical history, past social history, past surgical history and problem list. Problem list updated.  Objective  Blood pressure 100/60, weight 150 lb (68 kg), last menstrual period 07/28/2017. Body mass index is 28.81 kg/m. Pregravid weight 157 lb (71.2 kg) Total Weight Gain -7 lb (-3.175 kg)  Urinalysis: Urine dipstick shows negative for glucose, protein.  Fetal Status: Fetal Heart Rate (bpm): 142 Fundal Height: 30 cm Movement: Present     General:  Alert, oriented and cooperative. Patient is in no acute distress.  Skin: Skin is warm and dry. No rash noted.   Cardiovascular: Normal heart rate noted  Respiratory: Normal respiratory effort, no problems with respiration noted  Abdomen: Soft, gravid, appropriate for gestational age. Pain/Pressure: Absent     Pelvic:  Cervical exam deferred        Extremities: Normal range of motion.  Edema: None  Mental Status: Normal mood and affect. Normal behavior. Normal judgment and thought content.     Assessment   23 y.o. G3P0020 at [redacted]w[redacted]d, EDD 05/04/2018 by Last Menstrual Period  presenting for a routine prenatal visit.  Plan   pregnancy3 Problems (from 07/28/17 to present)    Problem Noted Resolved   Supervision of other normal pregnancy, antepartum 01/11/2018 by Tresea Mall, CNM No   Overview Addendum 02/14/2018  8:49 AM by Farrel Conners, CNM    [facility]inic [facility] Prenatal Labs  Dating LMP=8wk Blood type: O positive   Genetic Screen Requesting records.1 Screen:    AFP:     Quad:     NIPS: Antibody: Negative  Anatomic [place]  Rubella: Immune Varicella: Immune  GTT Early:               Third trimester:  RPR:   non reactive  Rhogam  HBsAg: Non-reactive   TDaP vaccine                       Flu Shot: HIV: Negative   Baby Food       breast                         GBS:   Contraception  Pap: negative August 2019  CBB     CS/VBAC    Support Person Husband Casimiro Needle           Discussed safe OTC medications for viral URI symptoms.   Please refer to After Visit Summary for other counseling recommendations.   Return in about 2 weeks (around 03/14/2018) for ROB.  Marcelyn Bruins, CNM 02/28/2018  8:59 AM

## 2018-03-14 ENCOUNTER — Ambulatory Visit (INDEPENDENT_AMBULATORY_CARE_PROVIDER_SITE_OTHER): Payer: BLUE CROSS/BLUE SHIELD | Admitting: Advanced Practice Midwife

## 2018-03-14 ENCOUNTER — Encounter: Payer: Self-pay | Admitting: Advanced Practice Midwife

## 2018-03-14 VITALS — BP 98/66 | Wt 156.0 lb

## 2018-03-14 DIAGNOSIS — Z3483 Encounter for supervision of other normal pregnancy, third trimester: Secondary | ICD-10-CM

## 2018-03-14 DIAGNOSIS — Z3A32 32 weeks gestation of pregnancy: Secondary | ICD-10-CM

## 2018-03-14 LAB — POCT URINALYSIS DIPSTICK OB
Glucose, UA: NEGATIVE
POC,PROTEIN,UA: NEGATIVE

## 2018-03-14 NOTE — Progress Notes (Signed)
  Routine Prenatal Care Visit  Subjective  Taylor Lucas is a 23 y.o. G3P0020 at [redacted]w[redacted]d being seen today for ongoing prenatal care.  She is currently monitored for the following issues for this low-risk pregnancy and has Encounter for gynecological examination; Screening for cervical cancer; Supervision of other normal pregnancy, antepartum; and Vomiting affecting pregnancy, antepartum on their problem list.  ----------------------------------------------------------------------------------- Patient reports no complaints.    . Vag. Bleeding: None.  Movement: Present. Denies leaking of fluid.  ----------------------------------------------------------------------------------- The following portions of the patient's history were reviewed and updated as appropriate: allergies, current medications, past family history, past medical history, past social history, past surgical history and problem list. Problem list updated.   Objective  Blood pressure 98/66, weight 156 lb (70.8 kg), last menstrual period 07/28/2017. Pregravid weight 157 lb (71.2 kg) Total Weight Gain -1 lb (-0.454 kg) Urinalysis: Urine Protein Negative  Urine Glucose Negative  Fetal Status: Fetal Heart Rate (bpm): 146 Fundal Height: 32 cm Movement: Present     General:  Alert, oriented and cooperative. Patient is in no acute distress.  Skin: Skin is warm and dry. No rash noted.   Cardiovascular: Normal heart rate noted  Respiratory: Normal respiratory effort, no problems with respiration noted  Abdomen: Soft, gravid, appropriate for gestational age. Pain/Pressure: Absent     Pelvic:  Cervical exam deferred        Extremities: Normal range of motion.     Mental Status: Normal mood and affect. Normal behavior. Normal judgment and thought content.   Assessment   23 y.o. G3P0020 at [redacted]w[redacted]d by  05/04/2018, by Last Menstrual Period presenting for routine prenatal visit  Plan   pregnancy3 Problems (from 07/28/17 to present)    Problem Noted Resolved   Supervision of other normal pregnancy, antepartum 01/11/2018 by Tresea Mall, CNM No   Overview Addendum 02/28/2018 12:35 PM by Oswaldo Conroy, CNM    [facility] Prenatal Labs  Dating LMP=8wk Blood type: O positive   Genetic Screen Requesting records.1 Screen:    AFP:     Quad:     NIPS: Antibody: Negative  Anatomic [place] Normal 01/11/18 Rubella: Immune Varicella: Immune  GTT Early:               Third trimester: 102 RPR:   non reactive  Rhogam  HBsAg: Non-reactive   TDaP vaccine  02/28/2018   Flu Shot: 01/11/18 HIV: Negative   Baby Food       breast                         GBS:   Contraception  Pap: negative August 2019  CBB     CS/VBAC    Support Person Husband Casimiro Needle              Preterm labor symptoms and general obstetric precautions including but not limited to vaginal bleeding, contractions, leaking of fluid and fetal movement were reviewed in detail with the patient.   Return in about 2 weeks (around 03/28/2018) for rob.  Tresea Mall, CNM 03/14/2018 9:20 AM

## 2018-03-14 NOTE — Progress Notes (Signed)
ROB

## 2018-03-28 ENCOUNTER — Ambulatory Visit (INDEPENDENT_AMBULATORY_CARE_PROVIDER_SITE_OTHER): Payer: BLUE CROSS/BLUE SHIELD | Admitting: Obstetrics and Gynecology

## 2018-03-28 VITALS — BP 130/86 | Wt 150.0 lb

## 2018-03-28 DIAGNOSIS — Z3A34 34 weeks gestation of pregnancy: Secondary | ICD-10-CM

## 2018-03-28 DIAGNOSIS — O26843 Uterine size-date discrepancy, third trimester: Secondary | ICD-10-CM

## 2018-03-28 DIAGNOSIS — Z3480 Encounter for supervision of other normal pregnancy, unspecified trimester: Secondary | ICD-10-CM | POA: Insufficient documentation

## 2018-03-28 LAB — POCT URINALYSIS DIPSTICK OB: Glucose, UA: NEGATIVE

## 2018-03-28 MED ORDER — ONDANSETRON 4 MG PO TBDP: 4.0000 mg | ORAL_TABLET | Freq: Four times a day (QID) | ORAL | 0 refills | Status: DC | PRN

## 2018-03-28 NOTE — Progress Notes (Signed)
    Routine Prenatal Care Visit  Subjective  Taylor Lucas is a 23 y.o. G3P0020 at [redacted]w[redacted]d being seen today for ongoing prenatal care.  She is currently monitored for the following issues for this low-risk pregnancy and has Encounter for gynecological examination; Vomiting affecting pregnancy, antepartum; and Supervision of other normal pregnancy, antepartum on their problem list.  ----------------------------------------------------------------------------------- Patient reports no complaints.  Started yesterday evening.    . Vag. Bleeding: None.  Movement: Present. Denies leaking of fluid.  ----------------------------------------------------------------------------------- The following portions of the patient's history were reviewed and updated as appropriate: allergies, current medications, past family history, past medical history, past social history, past surgical history and problem list. Problem list updated.   Objective  Blood pressure 130/86, weight 150 lb (68 kg), last menstrual period 07/28/2017. Pregravid weight 157 lb (71.2 kg) Total Weight Gain -7 lb (-3.175 kg) Urinalysis:      Fetal Status: Fetal Heart Rate (bpm): 140   Movement: Present     General:  Alert, oriented and cooperative. Patient is in no acute distress.  Skin: Skin is warm and dry. No rash noted.   Cardiovascular: Normal heart rate noted  Respiratory: Normal respiratory effort, no problems with respiration noted  Abdomen: Soft, gravid, appropriate for gestational age. Pain/Pressure: Absent     Pelvic:  Cervical exam deferred        Extremities: Normal range of motion.  Edema: None  ental Status: Normal mood and affect. Normal behavior. Normal judgment and thought content.     Assessment   23 y.o. G3P0020 at [redacted]w[redacted]d by  05/04/2018, by Last Menstrual Period presenting for routine prenatal visit  Plan   pregnancy3 Problems (from 07/28/17 to present)    Problem Noted Resolved   Supervision of other  normal pregnancy, antepartum 03/28/2018 by Vena Austria, MD No   Overview Signed 03/28/2018  8:19 AM by Vena Austria, MD    [facility]inic [facility] Prenatal Labs  Dating LMP=8wk Blood type: O positive   Genetic Screen Requesting records.1 Screen:    AFP:     Quad:     NIPS: Antibody: Negative  Anatomic [place] Normal 01/11/18 Rubella: Immune Varicella: Immune  GTT Early:               Third trimester: 102 RPR:   non reactive  Rhogam N/A HBsAg: Non-reactive   TDaP vaccine  02/28/2018   Flu Shot: 01/11/18 HIV: Negative   Baby Food  Breast                         GBS:   Contraception  Pap: negative August 2019  CBB     CS/VBAC    Support Person Husband Casimiro Needle              Gestational age appropriate obstetric precautions including but not limited to vaginal bleeding, contractions, leaking of fluid and fetal movement were reviewed in detail with the patient.    - Zofran Rx called in for nausea - Fundal height low, and 7lbs weight loss thus far this pregnancy, growth scan next visit  Return in about 1 week (around 04/04/2018) for ROB and growth.  Vena Austria, MD, [facility], [facility] 03/28/2018, 8:37 AM

## 2018-03-28 NOTE — Progress Notes (Signed)
ROB N&V 

## 2018-04-05 ENCOUNTER — Encounter: Payer: BLUE CROSS/BLUE SHIELD | Admitting: Obstetrics and Gynecology

## 2018-04-05 ENCOUNTER — Other Ambulatory Visit: Payer: BLUE CROSS/BLUE SHIELD

## 2018-04-07 ENCOUNTER — Ambulatory Visit: Payer: BLUE CROSS/BLUE SHIELD

## 2018-04-07 ENCOUNTER — Encounter: Payer: BLUE CROSS/BLUE SHIELD | Admitting: Obstetrics and Gynecology

## 2018-04-12 ENCOUNTER — Ambulatory Visit (INDEPENDENT_AMBULATORY_CARE_PROVIDER_SITE_OTHER): Payer: BLUE CROSS/BLUE SHIELD | Admitting: Certified Nurse Midwife

## 2018-04-12 ENCOUNTER — Other Ambulatory Visit (HOSPITAL_COMMUNITY)
Admission: RE | Admit: 2018-04-12 | Discharge: 2018-04-12 | Disposition: A | Discharge status: Home / Self Care | Payer: BLUE CROSS/BLUE SHIELD | Source: Ambulatory Visit | Attending: Obstetrics and Gynecology | Admitting: Obstetrics and Gynecology

## 2018-04-12 ENCOUNTER — Ambulatory Visit (INDEPENDENT_AMBULATORY_CARE_PROVIDER_SITE_OTHER): Payer: BLUE CROSS/BLUE SHIELD

## 2018-04-12 VITALS — BP 124/70 | Wt 158.0 lb

## 2018-04-12 DIAGNOSIS — Z3A36 36 weeks gestation of pregnancy: Secondary | ICD-10-CM

## 2018-04-12 DIAGNOSIS — Z113 Encounter for screening for infections with a predominantly sexual mode of transmission: Secondary | ICD-10-CM | POA: Diagnosis not present

## 2018-04-12 DIAGNOSIS — O26843 Uterine size-date discrepancy, third trimester: Secondary | ICD-10-CM | POA: Diagnosis not present

## 2018-04-12 DIAGNOSIS — Z3483 Encounter for supervision of other normal pregnancy, third trimester: Secondary | ICD-10-CM

## 2018-04-12 DIAGNOSIS — Z3685 Encounter for antenatal screening for Streptococcus B: Secondary | ICD-10-CM

## 2018-04-12 DIAGNOSIS — Z3480 Encounter for supervision of other normal pregnancy, unspecified trimester: Secondary | ICD-10-CM

## 2018-04-12 LAB — POCT URINALYSIS DIPSTICK OB

## 2018-04-12 NOTE — Progress Notes (Signed)
ROB/US Aptima/GBS No concerns

## 2018-04-12 NOTE — Progress Notes (Signed)
ROB at 36wk 6days: Baby active. Having some cramping. No LOF or vaginal bleeding Growth scan today for S<D. EFW in 20th% (5#13oz), cephalic, AFI 9.2cm GBS and Aptima today. Labor precautions [facility] instructions Breast/ Depo ROB in 1 week  Farrel Conners, CNM

## 2018-04-13 DIAGNOSIS — Z3685 Encounter for antenatal screening for Streptococcus B: Secondary | ICD-10-CM | POA: Diagnosis not present

## 2018-04-13 DIAGNOSIS — Z3A36 36 weeks gestation of pregnancy: Secondary | ICD-10-CM | POA: Diagnosis not present

## 2018-04-15 LAB — CERVICOVAGINAL ANCILLARY ONLY
Chlamydia: NEGATIVE
Neisseria Gonorrhea: NEGATIVE

## 2018-04-17 LAB — CULTURE, BETA STREP (GROUP B ONLY): Strep Gp B Culture: POSITIVE — AB

## 2018-04-19 ENCOUNTER — Encounter: Payer: Self-pay | Admitting: Advanced Practice Midwife

## 2018-04-19 ENCOUNTER — Ambulatory Visit (INDEPENDENT_AMBULATORY_CARE_PROVIDER_SITE_OTHER): Payer: BLUE CROSS/BLUE SHIELD | Admitting: Advanced Practice Midwife

## 2018-04-19 VITALS — BP 104/68 | Wt 159.0 lb

## 2018-04-19 DIAGNOSIS — Z3483 Encounter for supervision of other normal pregnancy, third trimester: Secondary | ICD-10-CM

## 2018-04-19 DIAGNOSIS — Z3A37 37 weeks gestation of pregnancy: Secondary | ICD-10-CM

## 2018-04-19 LAB — POCT URINALYSIS DIPSTICK OB
Glucose, UA: NEGATIVE
POC,PROTEIN,UA: NEGATIVE

## 2018-04-19 NOTE — Progress Notes (Signed)
  Routine Prenatal Care Visit  Subjective  Taylor Lucas is a 23 y.o. G3P0020 at [redacted]w[redacted]d being seen today for ongoing prenatal care.  She is currently monitored for the following issues for this low-risk pregnancy and has Encounter for gynecological examination; Vomiting affecting pregnancy, antepartum; and Supervision of other normal pregnancy, antepartum on their problem list.  ----------------------------------------------------------------------------------- Patient reports no complaints.   Contractions: Irritability. Taylor Lucas and some cramping. Vag. Bleeding: None.  Movement: Present. Denies leaking of fluid.  ----------------------------------------------------------------------------------- The following portions of the patient's history were reviewed and updated as appropriate: allergies, current medications, past family history, past medical history, past social history, past surgical history and problem list. Problem list updated.   Objective  Blood pressure 104/68, weight 159 lb (72.1 kg), last menstrual period 07/28/2017. Pregravid weight 157 lb (71.2 kg) Total Weight Gain 2 lb (0.907 kg) Urinalysis: Urine Protein    Urine Glucose    Fetal Status: Fetal Heart Rate (bpm): 135 Fundal Height: 35 cm Movement: Present  Presentation: Vertex  General:  Alert, oriented and cooperative. Patient is in no acute distress.  Skin: Skin is warm and dry. No rash noted.   Cardiovascular: Normal heart rate noted  Respiratory: Normal respiratory effort, no problems with respiration noted  Abdomen: Soft, gravid, appropriate for gestational age. Pain/Pressure: Absent     Pelvic:  Cervical exam deferred Dilation: 1 Effacement (%): 60 Station: -2  Extremities: Normal range of motion.  Edema: None  Mental Status: Normal mood and affect. Normal behavior. Normal judgment and thought content.   Assessment   23 y.o. G3P0020 at [redacted]w[redacted]d by  05/04/2018, by Last Menstrual Period presenting for routine  prenatal visit  Plan   pregnancy3 Problems (from 07/28/17 to present)    Problem Noted Resolved   Supervision of other normal pregnancy, antepartum 03/28/2018 by Vena Austria, MD No   Overview Addendum 04/12/2018  4:20 PM by Farrel Conners, CNM    [facility]inic [facility] Prenatal Labs  Dating LMP=8wk Blood type: O positive   Genetic Screen Requesting records.1 Screen:    AFP:     Quad:     NIPS: Antibody: Negative  Anatomic [place] Normal 01/11/18 Rubella: Immune Varicella: Immune  GTT Early:               Third trimester: 102 RPR:   non reactive  Rhogam N/A HBsAg: Non-reactive   TDaP vaccine  02/28/2018   Flu Shot: 01/11/18 HIV: Negative   Baby Food  Breast                         GBS:   Contraception Depo Pap: negative August 2019  CBB     CS/VBAC    Support Person Husband Taylor Needle              Term labor symptoms and general obstetric precautions including but not limited to vaginal bleeding, contractions, leaking of fluid and fetal movement were reviewed in detail with the patient.  Please refer to After Visit Summary for other counseling recommendations.   Return in about 1 week (around 04/26/2018) for rob.   Patient seen by Reuel Boom. Seymour Bars RN SNM with direct supervision by Tresea Mall CNM  Tresea Mall, CNM 04/19/2018 9:59 AM

## 2018-04-19 NOTE — Patient Instructions (Signed)

## 2018-04-19 NOTE — Addendum Note (Signed)
Addended by: [place], Bruce Churilla B on: 04/19/2018 10:08 AM   Modules accepted: Orders

## 2018-04-19 NOTE — Progress Notes (Signed)
ROB

## 2018-04-26 ENCOUNTER — Ambulatory Visit (INDEPENDENT_AMBULATORY_CARE_PROVIDER_SITE_OTHER): Payer: BLUE CROSS/BLUE SHIELD | Admitting: Maternal Newborn

## 2018-04-26 ENCOUNTER — Encounter: Payer: Self-pay | Admitting: Maternal Newborn

## 2018-04-26 VITALS — BP 122/70 | HR 107 | Wt 160.0 lb

## 2018-04-26 DIAGNOSIS — Z3480 Encounter for supervision of other normal pregnancy, unspecified trimester: Secondary | ICD-10-CM

## 2018-04-26 DIAGNOSIS — Z3A38 38 weeks gestation of pregnancy: Secondary | ICD-10-CM

## 2018-04-26 DIAGNOSIS — Z3483 Encounter for supervision of other normal pregnancy, third trimester: Secondary | ICD-10-CM

## 2018-04-26 NOTE — Progress Notes (Signed)
ROB C/o braxton hicks, a lot of pressure, denies lof, no vb, having good FM Desires cervical check and poss membrane sweep

## 2018-04-26 NOTE — Progress Notes (Signed)
    Routine Prenatal Care Visit  Subjective  Taylor Lucas is a 23 y.o. G3P0020 at [redacted]w[redacted]d being seen today for ongoing prenatal care.  She is currently monitored for the following issues for this low-risk pregnancy and has Encounter for gynecological examination; Vomiting affecting pregnancy, antepartum; and Supervision of other normal pregnancy, antepartum on their problem list.  ----------------------------------------------------------------------------------- Patient reports increased pelvic pressure. Occasional Braxton-Hicks contractions. Contractions: Not present. Vag. Bleeding: None.  Movement: Present. No leaking of fluid.  ----------------------------------------------------------------------------------- The following portions of the patient's history were reviewed and updated as appropriate: allergies, current medications, past family history, past medical history, past social history, past surgical history and problem list. Problem list updated.  Objective  Blood pressure 122/70, pulse (!) 107, weight 160 lb (72.6 kg), last menstrual period 07/28/2017. Pregravid weight 157 lb (71.2 kg) Total Weight Gain 3 lb (1.361 kg) Body mass index is 30.73 kg/m.   Urinalysis: Urine dipstick shows negative for glucose, positive for protein (trace).  Fetal Status: Fetal Heart Rate (bpm): 130   Movement: Present  Presentation: Vertex  General:  Alert, oriented and cooperative. Patient is in no acute distress.  Skin: Skin is warm and dry. No rash noted.   Cardiovascular: Normal heart rate noted  Respiratory: Normal respiratory effort, no problems with respiration noted  Abdomen: Soft, gravid, appropriate for gestational age. Pain/Pressure: Present     Pelvic:  Cervical exam performed Dilation: 1.5 Effacement (%): 60 Station: -2  Extremities: Normal range of motion.  Edema: None  Mental Status: Normal mood and affect. Normal behavior. Normal judgment and thought content.    Assessment    23 y.o. G3P0020 at [redacted]w[redacted]d, EDD 05/04/2018 by Last Menstrual Period presenting for a routine prenatal visit.  Plan   pregnancy3 Problems (from 07/28/17 to present)    Problem Noted Resolved   Supervision of other normal pregnancy, antepartum 03/28/2018 by Vena Austria, MD No   Overview Addendum 04/12/2018  4:20 PM by Farrel Conners, CNM    Clinic [facility] Prenatal Labs  Dating LMP=8wk Blood type: O positive   Genetic Screen Requesting records.1 Screen:    AFP:     Quad:     NIPS: Antibody: Negative  Anatomic [place] Normal 01/11/18 Rubella: Immune Varicella: Immune  GTT Early:               Third trimester: 102 RPR:   non reactive  Rhogam N/A HBsAg: Non-reactive   TDaP vaccine  02/28/2018   Flu Shot: 01/11/18 HIV: Negative   Baby Food  Breast                         GBS:   Contraception Depo Pap: negative August 2019  CBB     CS/VBAC    Support Person Husband Casimiro Needle            Term labor symptoms and general obstetric precautions including but not limited to vaginal bleeding, contractions, leaking of fluid and fetal movement were reviewed.  Please refer to After Visit Summary for other counseling recommendations.   Return in about 1 week (around 05/03/2018) for ROB.  Marcelyn Bruins, CNM 04/26/2018

## 2018-04-26 NOTE — Patient Instructions (Signed)

## 2018-04-28 ENCOUNTER — Inpatient Hospital Stay: Payer: BLUE CROSS/BLUE SHIELD | Admitting: Certified Registered Nurse Anesthetist

## 2018-04-28 ENCOUNTER — Inpatient Hospital Stay
Admission: EM | Admit: 2018-04-28 | Discharge: 2018-04-30 | DRG: 807 | Disposition: A | Discharge status: Home / Self Care | Payer: BLUE CROSS/BLUE SHIELD | Attending: Obstetrics and Gynecology | Admitting: Obstetrics and Gynecology

## 2018-04-28 ENCOUNTER — Other Ambulatory Visit: Payer: Self-pay

## 2018-04-28 DIAGNOSIS — O4292 Full-term premature rupture of membranes, unspecified as to length of time between rupture and onset of labor: Secondary | ICD-10-CM | POA: Diagnosis not present

## 2018-04-28 DIAGNOSIS — Z23 Encounter for immunization: Secondary | ICD-10-CM | POA: Diagnosis not present

## 2018-04-28 DIAGNOSIS — Z3A39 39 weeks gestation of pregnancy: Secondary | ICD-10-CM | POA: Diagnosis not present

## 2018-04-28 DIAGNOSIS — O26893 Other specified pregnancy related conditions, third trimester: Secondary | ICD-10-CM | POA: Diagnosis not present

## 2018-04-28 DIAGNOSIS — O99824 Streptococcus B carrier state complicating childbirth: Secondary | ICD-10-CM | POA: Diagnosis present

## 2018-04-28 DIAGNOSIS — O4290 Premature rupture of membranes, unspecified as to length of time between rupture and onset of labor, unspecified weeks of gestation: Secondary | ICD-10-CM | POA: Diagnosis present

## 2018-04-28 DIAGNOSIS — Z3480 Encounter for supervision of other normal pregnancy, unspecified trimester: Secondary | ICD-10-CM

## 2018-04-28 DIAGNOSIS — O4202 Full-term premature rupture of membranes, onset of labor within 24 hours of rupture: Secondary | ICD-10-CM | POA: Diagnosis not present

## 2018-04-28 LAB — CBC
HCT: 37.8 % (ref 36.0–46.0)
Hemoglobin: 12.6 g/dL (ref 12.0–15.0)
MCH: 30.1 pg (ref 26.0–34.0)
MCHC: 33.3 g/dL (ref 30.0–36.0)
MCV: 90.4 fL (ref 80.0–100.0)
Platelets: 194 10*3/uL (ref 150–400)
RBC: 4.18 MIL/uL (ref 3.87–5.11)
RDW: 13.2 % (ref 11.5–15.5)
WBC: 13.4 10*3/uL — ABNORMAL HIGH (ref 4.0–10.5)
nRBC: 0 % (ref 0.0–0.2)

## 2018-04-28 LAB — TYPE AND SCREEN
ABO/RH(D): O POS
Antibody Screen: NEGATIVE

## 2018-04-28 LAB — RUPTURE OF MEMBRANE (ROM)PLUS: Rom Plus: POSITIVE

## 2018-04-28 MED ORDER — EPHEDRINE 5 MG/ML INJ: 10.0000 mg | INTRAVENOUS | Status: DC | PRN

## 2018-04-28 MED ORDER — PENICILLIN G 3 MILLION UNITS IVPB - SIMPLE MED
3.0000 10*6.[IU] | INTRAVENOUS | Status: DC
  Administered 2018-04-28 (×3): 3 10*6.[IU] via INTRAVENOUS
  Filled 2018-04-28: qty 100
  Filled 2018-04-28: qty 3
  Filled 2018-04-28 (×6): qty 100
  Filled 2018-04-28: qty 3
  Filled 2018-04-28: qty 100

## 2018-04-28 MED ORDER — OXYTOCIN BOLUS FROM INFUSION
500.0000 mL | Freq: Once | INTRAVENOUS | Status: AC
  Administered 2018-04-28: 500 mL via INTRAVENOUS

## 2018-04-28 MED ORDER — BENZOCAINE-MENTHOL 20-0.5 % EX AERO
1.0000 "application " | INHALATION_SPRAY | CUTANEOUS | Status: DC | PRN
  Administered 2018-04-29: 1 via TOPICAL
  Filled 2018-04-28: qty 56

## 2018-04-28 MED ORDER — LIDOCAINE HCL (PF) 1 % IJ SOLN: 30.0000 mL | INTRAMUSCULAR | Status: DC | PRN

## 2018-04-28 MED ORDER — LACTATED RINGERS IV SOLN: 500.0000 mL | INTRAVENOUS | Status: DC | PRN

## 2018-04-28 MED ORDER — PHENYLEPHRINE 40 MCG/ML (10ML) SYRINGE FOR IV PUSH (FOR BLOOD PRESSURE SUPPORT): 80.0000 ug | PREFILLED_SYRINGE | INTRAVENOUS | Status: DC | PRN

## 2018-04-28 MED ORDER — DIBUCAINE 1 % RE OINT: 1.0000 "application " | TOPICAL_OINTMENT | RECTAL | Status: DC | PRN

## 2018-04-28 MED ORDER — ONDANSETRON HCL 4 MG PO TABS: 4.0000 mg | ORAL_TABLET | ORAL | Status: DC | PRN

## 2018-04-28 MED ORDER — OXYCODONE-ACETAMINOPHEN 5-325 MG PO TABS: 1.0000 | ORAL_TABLET | ORAL | Status: DC | PRN

## 2018-04-28 MED ORDER — DIPHENHYDRAMINE HCL 25 MG PO CAPS: 25.0000 mg | ORAL_CAPSULE | Freq: Four times a day (QID) | ORAL | Status: DC | PRN

## 2018-04-28 MED ORDER — FENTANYL CITRATE (PF) 100 MCG/2ML IJ SOLN
50.0000 ug | INTRAMUSCULAR | Status: DC | PRN
  Administered 2018-04-28: 50 ug via INTRAVENOUS
  Filled 2018-04-28: qty 2

## 2018-04-28 MED ORDER — MISOPROSTOL 200 MCG PO TABS
ORAL_TABLET | ORAL | Status: AC
  Filled 2018-04-28: qty 4

## 2018-04-28 MED ORDER — LIDOCAINE-EPINEPHRINE (PF) 1.5 %-1:200000 IJ SOLN
INTRAMUSCULAR | Status: DC | PRN
  Administered 2018-04-28: 3 mL via PERINEURAL

## 2018-04-28 MED ORDER — ACETAMINOPHEN 325 MG PO TABS: 650.0000 mg | ORAL_TABLET | ORAL | Status: DC | PRN

## 2018-04-28 MED ORDER — ONDANSETRON HCL 4 MG/2ML IJ SOLN: 4.0000 mg | INTRAMUSCULAR | Status: DC | PRN

## 2018-04-28 MED ORDER — FENTANYL 2.5 MCG/ML W/ROPIVACAINE 0.15% IN NS 100 ML EPIDURAL (ARMC)
EPIDURAL | Status: DC | PRN
  Administered 2018-04-28: 12 mL/h via EPIDURAL

## 2018-04-28 MED ORDER — SENNOSIDES-DOCUSATE SODIUM 8.6-50 MG PO TABS
2.0000 | ORAL_TABLET | ORAL | Status: DC
  Administered 2018-04-29: 2 via ORAL
  Filled 2018-04-28 (×2): qty 2

## 2018-04-28 MED ORDER — TERBUTALINE SULFATE 1 MG/ML IJ SOLN: 0.2500 mg | Freq: Once | INTRAMUSCULAR | Status: DC | PRN

## 2018-04-28 MED ORDER — SODIUM CHLORIDE 0.9 % IV SOLN
5.0000 10*6.[IU] | Freq: Once | INTRAVENOUS | Status: AC
  Administered 2018-04-28: 5 10*6.[IU] via INTRAVENOUS
  Filled 2018-04-28: qty 5

## 2018-04-28 MED ORDER — IBUPROFEN 600 MG PO TABS
600.0000 mg | ORAL_TABLET | Freq: Four times a day (QID) | ORAL | Status: DC
  Administered 2018-04-29 – 2018-04-30 (×6): 600 mg via ORAL
  Filled 2018-04-28 (×7): qty 1

## 2018-04-28 MED ORDER — FENTANYL 2.5 MCG/ML W/ROPIVACAINE 0.15% IN NS 100 ML EPIDURAL (ARMC)
EPIDURAL | Status: AC
  Filled 2018-04-28: qty 100

## 2018-04-28 MED ORDER — LIDOCAINE HCL (PF) 1 % IJ SOLN
INTRAMUSCULAR | Status: DC | PRN
  Administered 2018-04-28: 3 mL via INTRADERMAL

## 2018-04-28 MED ORDER — OXYTOCIN 40 UNITS IN NORMAL SALINE INFUSION - SIMPLE MED
2.5000 [IU]/h | INTRAVENOUS | Status: DC
  Administered 2018-04-28: 2.5 [IU]/h via INTRAVENOUS
  Filled 2018-04-28: qty 1000

## 2018-04-28 MED ORDER — OXYCODONE-ACETAMINOPHEN 5-325 MG PO TABS: 2.0000 | ORAL_TABLET | ORAL | Status: DC | PRN

## 2018-04-28 MED ORDER — ONDANSETRON HCL 4 MG/2ML IJ SOLN: 4.0000 mg | Freq: Four times a day (QID) | INTRAMUSCULAR | Status: DC | PRN

## 2018-04-28 MED ORDER — COCONUT OIL OIL
1.0000 "application " | TOPICAL_OIL | Status: DC | PRN
  Administered 2018-04-29: 1 via TOPICAL
  Filled 2018-04-28: qty 120

## 2018-04-28 MED ORDER — OXYTOCIN 40 UNITS IN NORMAL SALINE INFUSION - SIMPLE MED
1.0000 m[IU]/min | INTRAVENOUS | Status: DC
  Administered 2018-04-28: 2 m[IU]/min via INTRAVENOUS
  Administered 2018-04-28: 4 m[IU]/min via INTRAVENOUS
  Administered 2018-04-28: 6 m[IU]/min via INTRAVENOUS
  Administered 2018-04-28: 8 m[IU]/min via INTRAVENOUS

## 2018-04-28 MED ORDER — BUPIVACAINE HCL (PF) 0.25 % IJ SOLN
INTRAMUSCULAR | Status: DC | PRN
  Administered 2018-04-28: 2 mL via EPIDURAL
  Administered 2018-04-28: 4 mL via EPIDURAL

## 2018-04-28 MED ORDER — LACTATED RINGERS IV SOLN
INTRAVENOUS | Status: DC
  Administered 2018-04-28 (×2): via INTRAVENOUS

## 2018-04-28 MED ORDER — SIMETHICONE 80 MG PO CHEW: 80.0000 mg | CHEWABLE_TABLET | ORAL | Status: DC | PRN

## 2018-04-28 MED ORDER — DIPHENHYDRAMINE HCL 50 MG/ML IJ SOLN: 12.5000 mg | INTRAMUSCULAR | Status: DC | PRN

## 2018-04-28 MED ORDER — PRENATAL MULTIVITAMIN CH
1.0000 | ORAL_TABLET | Freq: Every day | ORAL | Status: DC
  Administered 2018-04-29: 1 via ORAL
  Filled 2018-04-28 (×2): qty 1

## 2018-04-28 MED ORDER — FENTANYL 2.5 MCG/ML W/ROPIVACAINE 0.15% IN NS 100 ML EPIDURAL (ARMC)
12.0000 mL/h | EPIDURAL | Status: DC
  Administered 2018-04-28: 12 mL/h via EPIDURAL

## 2018-04-28 MED ORDER — MEDROXYPROGESTERONE ACETATE 150 MG/ML IM SUSP: 150.0000 mg | INTRAMUSCULAR | Status: DC | PRN

## 2018-04-28 MED ORDER — OXYTOCIN 10 UNIT/ML IJ SOLN
INTRAMUSCULAR | Status: AC
  Filled 2018-04-28: qty 2

## 2018-04-28 MED ORDER — LIDOCAINE HCL (PF) 1 % IJ SOLN
INTRAMUSCULAR | Status: AC
  Filled 2018-04-28: qty 30

## 2018-04-28 MED ORDER — WITCH HAZEL-GLYCERIN EX PADS: 1.0000 "application " | MEDICATED_PAD | CUTANEOUS | Status: DC | PRN

## 2018-04-28 MED ORDER — AMMONIA AROMATIC IN INHA
RESPIRATORY_TRACT | Status: AC
  Filled 2018-04-28: qty 10

## 2018-04-28 MED ORDER — LACTATED RINGERS IV SOLN: 500.0000 mL | Freq: Once | INTRAVENOUS | Status: DC

## 2018-04-28 NOTE — Anesthesia Procedure Notes (Signed)
Epidural Patient location during procedure: OB Start time: 04/28/2018 2:38 PM End time: 04/28/2018 2:49 PM  Staffing Anesthesiologist: Yves Dill, MD Resident/CRNA: Ginger Carne, CRNA Performed: resident/CRNA   Preanesthetic Checklist Completed: patient identified, site marked, surgical consent, pre-op evaluation, timeout performed, IV checked, risks and benefits discussed and monitors and equipment checked  Epidural Patient position: sitting Prep: ChloraPrep Patient monitoring: heart rate, continuous pulse ox and blood pressure Approach: midline Location: L3-L4 Injection technique: LOR saline  Needle:  Needle type: Tuohy  Needle gauge: 17 G Needle length: 9 cm and 9 Needle insertion depth: 6 cm Catheter type: closed end flexible Catheter size: 19 Gauge Catheter at skin depth: 12 cm Test dose: negative and 1.5% lidocaine with Epi 1:200 K  Assessment Sensory level: T10 Events: blood not aspirated, injection not painful, no injection resistance, negative IV test and no paresthesia  Additional Notes 1 attempt Pt. Evaluated and documentation done after procedure finished. Patient identified. Risks/Benefits/Options discussed with patient including but not limited to bleeding, infection, nerve damage, paralysis, failed block, incomplete pain control, headache, blood pressure changes, nausea, vomiting, reactions to medication both or allergic, itching and postpartum back pain. Confirmed with bedside nurse the patient's most recent platelet count. Confirmed with patient that they are not currently taking any anticoagulation, have any bleeding history or any family history of bleeding disorders. Patient expressed understanding and wished to proceed. All questions were answered. Sterile technique was used throughout the entire procedure. Please see nursing notes for vital signs. Test dose was given through epidural catheter and negative prior to continuing to dose epidural or start  infusion. Warning signs of high block given to the patient including shortness of breath, tingling/numbness in hands, complete motor block, or any concerning symptoms with instructions to call for help. Patient was given instructions on fall risk and not to get out of bed. All questions and concerns addressed with instructions to call with any issues or inadequate analgesia.   Patient tolerated the insertion well without immediate complications.Reason for block:procedure for pain

## 2018-04-28 NOTE — Progress Notes (Signed)
  Labor Progress Note   23 y.o. [id number] @ [redacted]w[redacted]d , admitted for  Pregnancy, Labor Management. Prelabor rupture of membranes at term.  Subjective:  Comfortable, contractions have slowed down a lot.  Objective:  BP 128/79 (BP Location: Right Arm)   Pulse 80   Temp 97.9 F (36.6 C) (Oral)   Resp 16   Ht 5' 0.5" (1.537 m)   Wt 72.5 kg   LMP 07/28/2017 (Exact Date)   BMI 30.70 kg/m  Abd: gravid, non-tender Extr: trace to 1+ bilateral pedal edema SVE: 2/60/-2  EFM: FHR: 115 bpm, variability: moderate,  accelerations:  Present,  decelerations:  Present occasional variable Toco: Infrequent contractions Labs: I have reviewed the patient's lab results.   Assessment & Plan:  [id number] @ [redacted]w[redacted]d, admitted for  Pregnancy and Labor/Delivery Management  1. Pain management: none currently, patient's choice as needed. 2. FWB: FHT category II, occasional variables, FWB reassuring.  3. ID: GBS positive, receiving second dose of PCN now 4. Labor management: Pitocin augmentation due to contractions slowing and no cervical change since admission.  All discussed with patient, see orders.  Marcelyn Bruins, CNM 04/28/2018

## 2018-04-28 NOTE — OB Triage Note (Signed)
Patient came in c/o of possible ROM. Patient reports having clear gush of fluids while at home. Reports fluid was clear. Denies vaginal bleeding. Reports occasional contractions, however not painful. Reports positive FM. Will continue to assess and report to provider.

## 2018-04-28 NOTE — Anesthesia Preprocedure Evaluation (Signed)
Anesthesia Evaluation  Patient identified by MRN, date of birth, ID band Patient awake    Reviewed: Allergy & Precautions, H&P , NPO status , Patient's Chart, lab work & pertinent test results  Airway Mallampati: II       Dental no notable dental hx.    Pulmonary neg pulmonary ROS, resolved,    Pulmonary exam normal        Cardiovascular Exercise Tolerance: Good negative cardio ROS Normal cardiovascular exam     Neuro/Psych    GI/Hepatic negative GI ROS, Neg liver ROS,   Endo/Other  negative endocrine ROS  Renal/GU negative Renal ROS     Musculoskeletal   Abdominal   Peds  Hematology negative hematology ROS (+)   Anesthesia Other Findings   Reproductive/Obstetrics (+) Pregnancy                             Anesthesia Physical Anesthesia Plan  ASA: II  Anesthesia Plan: Epidural   Post-op Pain Management:    Induction:   PONV Risk Score and Plan:   Airway Management Planned:   Additional Equipment:   Intra-op Plan:   Post-operative Plan:   Informed Consent: I have reviewed the patients History and Physical, chart, labs and discussed the procedure including the risks, benefits and alternatives for the proposed anesthesia with the patient or authorized representative who has indicated his/her understanding and acceptance.     Dental Advisory Given  Plan Discussed with: Anesthesiologist  Anesthesia Plan Comments:         Anesthesia Quick Evaluation

## 2018-04-28 NOTE — H&P (Addendum)
Obstetrics Admission History & Physical   Rupture of Membranes   HPI:  23 y.o. [id number] @ [redacted]w[redacted]d(05/04/2018, by Last Menstrual Period). Admitted on 04/28/2018:   Patient Active Problem List   Diagnosis Date Noted  . PROM (premature rupture of membranes) 04/28/2018  . Supervision of other normal pregnancy, antepartum 03/28/2018  . Vomiting affecting pregnancy, antepartum 02/07/2018     Presents for leaking fluid at home, with a gush of clear fluid around 2100. Small amounts at first, and then continued leakage. No odor. Denies vaginal bleeding. She is feeling mild contractions. Her baby is moving well.  [facility] complicated by GBS positive.  ROS: A review of systems was performed and negative, except as stated in the above HPI.  PMHx:  Past Medical History:  Diagnosis Date  . Dysmenorrhea   . Family history of breast cancer    mom age 24 BRCA neg; pt qualifies for update testing when older  . Rib fracture 10/2008   sports injury   PSHx:  Past Surgical History:  Procedure Laterality Date  . DILATION AND CURETTAGE OF UTERUS    . nexplanon     2015   Medications:  Medications Prior to Admission  Medication Sig Dispense Refill Last Dose  . Prenatal Vit-Fe Fumarate-FA (MULTIVITAMIN-PRENATAL) 27-0.8 MG TABS tablet Take 1 tablet by mouth daily at 12 noon.   04/27/2018 at Unknown time   Allergies: has No Known Allergies. OBHx:  OB History  Gravida Para Term Preterm AB Living  3       2    SAB TAB Ectopic Multiple Live Births  1 1          # Outcome Date GA Lbr Len/2nd Weight Sex Delivery Anes PTL Lv  3 Current           2 SAB           1 TAB            FHx:  Family History  Problem Relation Age of Onset  . Breast cancer Mother 443      BRCA neg  . Hypertension Father   . Kidney Stones Father   . Renal cancer Paternal Grandmother   . Diabetes Paternal Grandfather   . Stroke Maternal Grandfather    Soc Hx: Never smoker, Alcohol: none  and Recreational drug use: none  Objective:   Vitals:   04/28/18 0140 04/28/18 0156  BP: 125/87 123/85  Pulse: (!) 110 (!) 104  Resp:    Temp:     Constitutional: Well nourished, well developed female in no acute distress.  HEENT: normal Skin: Warm and dry.  Cardiovascular: Regular rate and rhythm, mild tachycardia.   Extremity: trace to 1+ bilateral pedal edema Respiratory: Clear to auscultation bilaterally. Normal respiratory effort Abdomen: gravid, non-tender Neuro: Cranial nerves grossly intact Psych: Alert and Oriented x3. No memory deficits. Normal mood and affect.  MS: normal gait, normal bilateral lower extremity ROM/strength/stability.  Cervix: 2/60/-2 (RN exam) Uterus: Spontaneous uterine activity   EFM:FHR: 115 bpm, variability: moderate,  accelerations:  Present,  decelerations:  Absent Toco: Frequency: Every 2-4 minutes, Duration: 40-50 seconds and Intensity: mild   Perinatal info:  Blood type: O positive Rubella- Immune Varicella -Immune TDaP Given during third trimester of this pregnancy on 02/28/2018 RPR NR / HIV Neg/ HBsAg Neg   Assessment & Plan:   23y.o. [id number]@ 366w1dAdmitted on 04/28/2018 with rupture of membranes.    Admit for  labor, Antibiotics for GBS prophylaxis, Observe for cervical change, Fetal Wellbeing Reassuring and Epidural when ready  Avel Sensor, CNM [facility], [facility] 04/28/2018

## 2018-04-28 NOTE — Plan of Care (Signed)
  Problem: Health Behavior/Discharge Planning: Goal: Ability to manage health-related needs will improve Outcome: Progressing   Problem: Clinical Measurements: Goal: Ability to maintain clinical measurements within normal limits will improve Outcome: Progressing   Problem: Clinical Measurements: Goal: Will remain free from infection Outcome: Progressing   

## 2018-04-29 LAB — CBC
HCT: 30.4 % — ABNORMAL LOW (ref 36.0–46.0)
HEMOGLOBIN: 10 g/dL — AB (ref 12.0–15.0)
MCH: 30.5 pg (ref 26.0–34.0)
MCHC: 32.9 g/dL (ref 30.0–36.0)
MCV: 92.7 fL (ref 80.0–100.0)
PLATELETS: 158 10*3/uL (ref 150–400)
RBC: 3.28 MIL/uL — AB (ref 3.87–5.11)
RDW: 13.2 % (ref 11.5–15.5)
WBC: 12.7 10*3/uL — ABNORMAL HIGH (ref 4.0–10.5)
nRBC: 0 % (ref 0.0–0.2)

## 2018-04-29 LAB — RPR: RPR Ser Ql: NONREACTIVE

## 2018-04-29 NOTE — Anesthesia Postprocedure Evaluation (Signed)
Anesthesia Post Note  Patient: Taylor Lucas  Procedure(s) Performed: AN AD HOC LABOR EPIDURAL  Patient location during evaluation: Mother Baby Anesthesia Type: Epidural Level of consciousness: oriented and awake and alert Pain management: satisfactory to patient Vital Signs Assessment: post-procedure vital signs reviewed and stable Respiratory status: respiratory function stable Cardiovascular status: stable Postop Assessment: no headache, no backache, epidural receding, adequate PO intake, patient able to bend at knees, no apparent nausea or vomiting and able to ambulate Anesthetic complications: no     Last Vitals:  Vitals:   04/29/18 0130 04/29/18 0533  BP: 111/67 103/61  Pulse: 84 84  Resp: 18 20  Temp: 36.8 C 36.4 C  SpO2: 99% 100%    Last Pain:  Vitals:   04/29/18 0533  TempSrc: Oral  PainSc:                  Clydene Pugh

## 2018-04-29 NOTE — Lactation Note (Signed)
This note was copied from a baby's chart. Lactation Consultation Note  Patient Name: Taylor Lucas [id number] Date: 04/29/2018 Reason for consult: Follow-up assessment   Maternal Data    Feeding Feeding Type: Breast Milk  LATCH Score Latch: Too sleepy or reluctant, no latch achieved, no sucking elicited.  Audible Swallowing: None  Type of Nipple: Everted at rest and after stimulation  Comfort (Breast/Nipple): Soft / non-tender  Hold (Positioning): Assistance needed to correctly position infant at breast and maintain latch.  LATCH Score: 5  Interventions Interventions: Hand express  Lactation Tools Discussed/Used     Consult Status Consult Status: Follow-up Date: 04/29/18 LC attempted to assist mother with latch and positioning for breastfeeding. Infant seemed to not latch on well and could not maintain latch. A gloved finger was used to assess infant's suck. Infant is uncoordinated with suck, is darting her tongue back and forth and does not have rhythmic movement of tongue. LC allowed parents to insert a gloved finger inside infant's mouth to practice sucking and mother placed infant skin to skin afterwards. Father states that infant's suck improved slightly after some practice using the gloved finger. LC returned to assist mother with hand expression and was able to hand express multiple drops of colostrum that was spoon fed to infant. Mother has a crack and soreness on her right nipple and wants to try pumping. LC provided a pump kit, instructed mother on the use of the pump and encouraged her to pump every 2-3 hours for 15 minutes until infant is able to coordinate suck to breastfeed.     Elvera Lennox 04/23/4968, 1:52 PM

## 2018-04-30 ENCOUNTER — Encounter: Payer: Self-pay | Admitting: Anesthesiology

## 2018-04-30 LAB — HEPATITIS B SURFACE ANTIGEN: HEP B S AG: NEGATIVE

## 2018-04-30 MED ORDER — IBUPROFEN 600 MG PO TABS: 600.0000 mg | ORAL_TABLET | Freq: Four times a day (QID) | ORAL | 0 refills | Status: DC | PRN

## 2018-04-30 NOTE — Discharge Instructions (Signed)
Breast Pumping Tips There may be times when you cannot feed your baby from your breast, such as when you are at work or on a trip. Breast pumping allows you to remove milk from your breast in order to store for later use. There are three ways to pump. You can use:  Your hand to massage and squeeze your breast (hand expression).  A handheld manual pump.  An electric pump. When you first start to pump, you may not get much milk, but after a few days your breasts should start to make more. Pumping can help stimulate your milk supply after your baby is born. It can also help maintain your milk supply when you are away from your baby. When should I pump? You can start pumping soon after your baby is born. Here are some tips on when to pump:  When with your baby: ? Pump after breastfeeding. ? Pump from the free breast while you breastfeed.  When away from your baby: ? Pump every 2-3 hours for about 15 minutes. ? Pump both breasts at the same time if you can.  If your baby gets formula feeding, pump around the time your baby gets that feeding.  If you drank alcohol, wait 2 hours before pumping.  If you are having a procedure with anesthesia, talk to your health care provider about when you should pump before and after. How do I prepare to pump? Take steps to relax. This makes it easier to stimulate your let-down reflex, which is what makes breast milk flow. To help:  Smell one of your infant's blankets or an item of clothing.  Look at a picture or video of your infant.  Sit in a quiet, private space.  Massage your breast and nipple.  Place a warm cloth on your breast. The cloth should be a little wet.  Play relaxing music.  Picture your milk flowing. What are some tips? General tips for pumping breast milk  Always wash your hands before pumping.  If you are not getting very much milk or pumping is uncomfortable, make adjustments to your pump or try using different type of  pumps.  Drink enough fluid to keep your urine clear or pale yellow.  Wear clothing that opens in the front or allows easy access to your breasts.  Pump breast milk directly into clean bottles or other storage containers.  Do not use any products that contain nicotine or tobacco, such as cigarettes and e-cigarettes. These can lower your milk supply and harm your infant. If you need help quitting, ask your health care provider. Tips for storing breast milk  Store breast milk in a clean, BPA-free container, such as glass or plastic bottles or milk storage bags.  Store breast milk in 2-4 ounce batches to reduce waste.  Swirl the breast milk in the container to mix any cream that floats to the top. Do not shake it.  Label all stored milk with the date you pumped it.  The amount of time you can keep breast milk depends on where it is stored: ? Room temperature: 6-8 hours, if the milk is clean. It is best if used within 4 hours. ? Cooler with ice packs: 24 hours. ? Refrigerator: 5-8 days, if the milk is clean. It is best if used within 3 days. ? Freezer: 9-12 months, if the milk is clean and stored away from the freezer door. It is best if used within 6 months.  When using a refrigerator or freezer,  put the milk in the back to keep it as cold as possible.  Thaw frozen milk using warm water. Do not use the microwave. Tips for choosing a breast pump The right pump for you will depend on your comfort and how often you will be away from your baby. When choosing a pump, consider the following:  Manual breast pumps do not need electricity to work. They are usually cheaper than electric pumps, but they can be harder to use. They may be a good choice if you are occasionally away from your baby.  Electric breast pumps are usually more expensive than manual pumps, but they can be easier for some women to use. They can also collect more milk than manual pumps. This makes them a good choice for women  who work in an office or need to be away from their baby for longer periods of time.  The suction cup (flange) should be the right size. If it is the wrong size, it may cause pain and nipple damage.  Before buying a pump, find out whether your insurance covers the cost of a breast pump. Tips for maintaining a breast pump  Check your pump's manual for cleaning tips.  Clean the pump after each use. To do this: ? Wipe down the electrical unit. Use a dry, soft cloth or clean paper towel. Do not put the electrical unit in water or cleaning products. ? Wash the plastic pump parts with soap and warm water or in the dishwasher, if the parts are dishwasher safe. You do not need to clean the tubing unless it comes in contact with breast milk. Let the parts air dry. Avoid drying them with a cloth or towel. ? When the pump parts are clean and dry, put the pump back together. Then store the pump.  If there is water in the tubing when it comes time to pump, attach the tubing to the pump and turn on the pump. Run the pump until the tube is dry.  Avoid touching the inside of pump parts that come in contact with breast milk. Summary  Pumping can help stimulate your milk supply after your baby is born. It can also help maintain your milk supply when you are away from your baby.  When you are away from your infant for several hours, pump for about 15 minutes every 2-3 hours. Pump both breasts at the same time, if you can.  Your health care provider or lactation consultant can help you decide which breast pump is right for you. The right pump for you depends on your comfort, work schedule, and how often you may be away from your baby. This information is not intended to replace advice given to you by your health care provider. Make sure you discuss any questions you have with your health care provider. Document Released: 07/30/2009 Document Revised: 10/29/2017 Document Reviewed: 03/16/2016 Elsevier Interactive  Patient Education  2019 [employer]. Call your doctor for increased pain or vaginal bleeding, temperature above 100.4, depression, or concerns.  Increase calories and fluids while breastfeeding.  Continue prenatal vitamin and iron.  No strenuous activity or heavy lifting for 6 weeks.  No intercourse, tampons, or douching for 6 weeks.  No tub baths- showers only.  No driving for 2 weeks or while taking pain medications.

## 2018-04-30 NOTE — Discharge Summary (Signed)
OB Discharge Summary     Patient Name: Taylor Lucas DOB: June 01, 1995 MRN: [id number]  Date of admission: 04/28/2018 Delivering MD: Thomasene Mohair, MD  Date of Delivery: 04/30/2018  Date of discharge: 04/30/2018  Admitting diagnosis: 39 Weeks Intrauterine pregnancy: [redacted]w[redacted]d     Secondary diagnosis: None     Discharge diagnosis: Term Pregnancy Delivered                                                                                                Post partum procedures:none  Augmentation: Pitocin  Complications: None  Hospital course:  Onset of Labor With Vaginal Delivery     23 y.o. yo [id number] at [redacted]w[redacted]d was admitted in Latent Labor on 04/28/2018. Patient had an uncomplicated labor course as follows:  Membrane Rupture Time/Date: 9:00 PM ,04/27/2018   Intrapartum Procedures: Episiotomy: None [1]                                         Lacerations:  2nd degree [3]  Patient had a delivery of a Viable infant. 04/28/2018  Information for the patient's newborn:  Dorcus, Alejandre Girl Jenise [[id number]]  Delivery Method: Vag-Spont    Pateint had an uncomplicated postpartum course.  She is ambulating, tolerating a regular diet, passing flatus, and urinating well. Patient is discharged home in stable condition on 04/30/18.   Physical exam  Vitals:   04/29/18 0805 04/29/18 1605 04/29/18 2302 04/30/18 0913  BP: 114/72 (!) 123/93 112/86 111/68  Pulse: 70 88 (!) 104 69  Resp: 18 20 16 18   Temp: 98.2 F (36.8 C) 98 F (36.7 C) 98.2 F (36.8 C) 98 F (36.7 C)  TempSrc: Oral Oral  Oral  SpO2: 99% 98% 99% 99%  Weight:      Height:       General: alert, cooperative and no distress Lochia: appropriate Uterine Fundus: firm Incision: N/A DVT Evaluation: No evidence of DVT seen on physical exam. No cords or calf tenderness. No significant calf/ankle edema.  Labs: Lab Results  Component Value Date   WBC 12.7 (H) 04/29/2018   HGB 10.0 (L) 04/29/2018   HCT 30.4 (L) 04/29/2018   MCV 92.7  04/29/2018   PLT 158 04/29/2018    Discharge instruction: per After Visit Summary.  Medications:  Allergies as of 04/30/2018   No Known Allergies     Medication List    TAKE these medications   ibuprofen 600 MG tablet Commonly known as:  ADVIL,MOTRIN Take 1 tablet (600 mg total) by mouth every 6 (six) hours as needed.   multivitamin-prenatal 27-0.8 MG Tabs tablet Take 1 tablet by mouth daily at 12 noon.            Discharge Care Instructions  (From admission, onward)         Start     Ordered   04/30/18 0000  Discharge wound care:    Comments:  Perform wound care instructions   04/30/18 1250          Diet:  routine diet  Activity: Advance as tolerated. Pelvic rest for 6 weeks.   Outpatient follow up: Follow-up Information    Vena Austria, MD Follow up in 6 week(s).   Specialty:  Obstetrics and Gynecology Contact information: [address] [place] [address] [phone]             Postpartum contraception: Depo Provera Rhogam Given postpartum: no Rubella vaccine given postpartum: unknown, per OSH documentation, she is immune. Varicella vaccine given postpartum: no TDaP given antepartum or postpartum: 02/28/2018 Influenza vaccine: 01/11/2018  Newborn Data: Live born female  Birth Weight: 5 lb 10 oz (2550 g) APGAR: 9, 9  Newborn Delivery   Birth date/time:  04/28/2018 17:32:00 Delivery type:  Vaginal, Spontaneous      Baby Feeding: Breast  Disposition:home with mother  SIGNED: Thomasene Mohair, MD 04/30/2018 12:50 PM

## 2018-04-30 NOTE — Lactation Note (Signed)
This note was copied from a baby's chart. Lactation Consultation Note  Patient Name: Taylor Lucas [id number] Date: 04/30/2018 Reason for consult: Follow-up assessment;Mother's request;Difficult latch;Primapara  Mom's present plan has been to Pump first on side not planning to breast feed on, then give that with a bottle and then attempt to breast feed.  She reports the baby just falling asleep at the breast.  Discussed plan to breast feed first trying first without Nipple Shield and then with nipple shield if she will not latch without it before introducing the bottle as soon as she demonstrated early feeding cues.  Victory Dakin was just starting to stir.  Mom's nipples are everted and slightly tender.  Demonstrated how to hand express before feeding to entice him to latch and after ward to prevent bacteria, lubricate and help with soreness.  Mom already has coconut oil and comfort gels.  Instructed in rotating use.  Hand expressed colostrum easily.  Attempted at the breast first without nipple shield several times, but she would not open mouth wide at all, just keeping her lips pierced no matter what we tried.  Placed nipple shield on breast and put previously pumped colostrum in tip of nipple shield.  Finally got her to latch, but only took a few weak sucks before falling asleep.  She would suck more vigorously when introduced curved tip syringe along side of nipple shield giving small increments of colostrum.  Mom had previously pumped 10 ml which was given over 12 minutes at the breast.  If we were not giving any colostrum, she would just sleep at the breast, only sucking when gave a little colostrum.  Mom has a medela DEBP at home that she had gotten through her [employer].  Mom plans to keep trying to put her to the breast using nipple shield for now hoping that when the large volumes of mature milk come in that she will stay awake for more consistent rhythmic sucking.  Reviewed supply and demand, normal  course of lactation and routine newborn feeding patterns.  Stressed importance of draining breast as mature milk continued to transition in to prevent engorgement.  Mom has an appointment with Meriam Sprague Monday at 10 am at the [facility].  Other community resources given such as support group and lactation contact numbers if had further questions, concerns or needed help weaning from nipple shield, latching, etc.        Maternal Data Formula Feeding for Exclusion: No Has patient been taught Hand Expression?: Yes  Feeding Feeding Type: Breast Fed  LATCH Score Latch: Repeated attempts needed to sustain latch, nipple held in mouth throughout feeding, stimulation needed to elicit sucking reflex.  Audible Swallowing: A few with stimulation  Type of Nipple: Everted at rest and after stimulation  Comfort (Breast/Nipple): Filling, red/small blisters or bruises, mild/mod discomfort  Hold (Positioning): Assistance needed to correctly position infant at breast and maintain latch.  LATCH Score: 6  Interventions Interventions: Breast feeding basics reviewed;Assisted with latch;Skin to skin;Breast massage;Hand express;Reverse pressure;Breast compression;Adjust position;Support pillows;Position options;Expressed milk;Coconut oil;Comfort gels;DEBP  Lactation Tools Discussed/Used Tools: Nipple Shields Nipple shield size: 20 WIC Program: Yes(Also has [employer]) Pump Review: Setup, frequency, and cleaning;Milk Storage;Other (comment) Initiated by:: Sherren Kerns Date initiated:: 04/29/18   Consult Status Consult Status: PRN Follow-up type: Call as needed    Louis Meckel 04/30/2018, 1:34 PM

## 2018-04-30 NOTE — Progress Notes (Addendum)
Discharge instructions provided.  Pt and sig other verbalize understanding of all instructions and follow-up care.  Pt discharged to home with infant at 1740 on 04/30/18 via wheelchair by RN. Reynold Bowen, RN 04/30/2018 5:51 PM

## 2018-05-03 ENCOUNTER — Encounter: Payer: BLUE CROSS/BLUE SHIELD | Admitting: Obstetrics and Gynecology

## 2018-06-13 ENCOUNTER — Ambulatory Visit (INDEPENDENT_AMBULATORY_CARE_PROVIDER_SITE_OTHER): Payer: BLUE CROSS/BLUE SHIELD | Admitting: Obstetrics and Gynecology

## 2018-06-13 ENCOUNTER — Encounter: Payer: Self-pay | Admitting: Obstetrics and Gynecology

## 2018-06-13 ENCOUNTER — Other Ambulatory Visit: Payer: Self-pay

## 2018-06-13 DIAGNOSIS — Z30011 Encounter for initial prescription of contraceptive pills: Secondary | ICD-10-CM

## 2018-06-13 DIAGNOSIS — Z1389 Encounter for screening for other disorder: Secondary | ICD-10-CM | POA: Diagnosis not present

## 2018-06-13 MED ORDER — NORETHIN-ETH ESTRAD-FE BIPHAS 1 MG-10 MCG / 10 MCG PO TABS: 1.0000 | ORAL_TABLET | Freq: Every day | ORAL | 0 refills | Status: DC

## 2018-06-13 NOTE — Progress Notes (Signed)
   Postpartum Visit  Chief Complaint:  Chief Complaint  Patient presents with  . Postpartum Care    Vaginal delivery 3/5    History of Present Illness: Patient is a 23 y.o. G3P1021 presents for postpartum visit.  Date of delivery: 04/28/2018 Type of delivery: Vaginal delivery - Vacuum or forceps assisted  yes Episiotomy No.  Laceration: yes 2nd degree Pregnancy or labor problems:  no Any problems since the delivery:  no  Newborn Details:  SINGLETON :  1. BabyGender female. Birth weight: 5lbs 10oz Maternal Details:  Breast or formula feeding: plans to bottle feed Any bowel or bladder issues: No  Post partum depression/anxiety noted:  no Edinburgh Post-Partum Depression Score: 1 Date of last PAP: 05/27/2016  no abnormalities   Review of Systems: Review of Systems  Constitutional: Negative.   Genitourinary: Negative.   Skin: Negative.   Psychiatric/Behavioral: Negative.     The following portions of the patient's history were reviewed and updated as appropriate: allergies, current medications, past family history, past medical history, past social history, past surgical history and problem list.  Past Medical History:  Past Medical History:  Diagnosis Date  . Dysmenorrhea   . Family history of breast cancer    mom age 44; BRCA neg; pt qualifies for update testing when older  . Rib fracture 10/2008   sports injury    Past Surgical History:  Past Surgical History:  Procedure Laterality Date  . DILATION AND CURETTAGE OF UTERUS    . nexplanon     2015    Family History:  Family History  Problem Relation Age of Onset  . Breast cancer Mother 44       BRCA neg  . Hypertension Father   . Kidney Stones Father   . Renal cancer Paternal Grandmother   . Diabetes Paternal Grandfather   . Stroke Maternal Grandfather     Social History:  Social History   Socioeconomic History  . Marital status: Married    Spouse name: Michael  . Number of children: 0  . Years  of education: Not on file  . Highest education level: Not on file  Occupational History  . Not on file  Social Needs  . Financial resource strain: Not on file  . Food insecurity:    Worry: Not on file    Inability: Not on file  . Transportation needs:    Medical: Not on file    Non-medical: Not on file  Tobacco Use  . Smoking status: Never Smoker  . Smokeless tobacco: Never Used  Substance and Sexual Activity  . Alcohol use: Yes  . Drug use: No  . Sexual activity: Yes    Birth control/protection: None  Lifestyle  . Physical activity:    Days per week: Not on file    Minutes per session: Not on file  . Stress: Not on file  Relationships  . Social connections:    Talks on phone: Not on file    Gets together: Not on file    Attends religious service: Not on file    Active member of club or organization: Not on file    Attends meetings of clubs or organizations: Not on file    Relationship status: Not on file  . Intimate partner violence:    Fear of current or ex partner: Not on file    Emotionally abused: Not on file    Physically abused: Not on file    Forced sexual activity: Not on   file  Other Topics Concern  . Not on file  Social History Narrative  . Not on file    Allergies:  No Known Allergies  Medications: Prior to Admission medications   Medication Sig Start Date End Date Taking? Authorizing Provider  ibuprofen (ADVIL,MOTRIN) 600 MG tablet Take 1 tablet (600 mg total) by mouth every 6 (six) hours as needed. Patient not taking: Reported on 06/13/2018 04/30/18   Jackson, Stephen D, MD  Prenatal Vit-Fe Fumarate-FA (MULTIVITAMIN-PRENATAL) 27-0.8 MG TABS tablet Take 1 tablet by mouth daily at 12 noon.    [provider]    Physical Exam Blood pressure 122/80, pulse 75, height 5' 0.5" (1.537 m), weight 148 lb (67.1 kg), not currently breastfeeding.    General: NAD HEENT: normocephalic, anicteric Pulmonary: No increased work of breathing Abdomen: NABS,  soft, non-tender, non-distended.  Umbilicus without lesions.  No hepatomegaly, splenomegaly or masses palpable. No evidence of hernia. Genitourinary:  External: Normal external female genitalia.  Normal urethral meatus, normal  Bartholin's and Skene's glands.    Vagina: Normal vaginal mucosa, no evidence of prolapse.    Cervix: Grossly normal in appearance, no bleeding  Uterus: Non-enlarged, mobile, normal contour.  No CMT  Adnexa: ovaries non-enlarged, no adnexal masses  Rectal: deferred Extremities: no edema, erythema, or tenderness Neurologic: Grossly intact Psychiatric: mood appropriate, affect full  Edinburgh Postnatal Depression Scale - 06/13/18 0940      Edinburgh Postnatal Depression Scale:  In the Past 7 Days   I have been able to laugh and see the funny side of things.  0    I have looked forward with enjoyment to things.  0    I have blamed myself unnecessarily when things went wrong.  0    I have been anxious or worried for no good reason.  0    I have felt scared or panicky for no good reason.  0    Things have been getting on top of me.  1    I have been so unhappy that I have had difficulty sleeping.  0    I have felt sad or miserable.  0    I have been so unhappy that I have been crying.  0    The thought of harming myself has occurred to me.  0    Edinburgh Postnatal Depression Scale Total  1       Edinburgh Postnatal Depression Scale - 06/13/18 0940      Edinburgh Postnatal Depression Scale:  In the Past 7 Days   I have been able to laugh and see the funny side of things.  0    I have looked forward with enjoyment to things.  0    I have blamed myself unnecessarily when things went wrong.  0    I have been anxious or worried for no good reason.  0    I have felt scared or panicky for no good reason.  0    Things have been getting on top of me.  1    I have been so unhappy that I have had difficulty sleeping.  0    I have felt sad or miserable.  0    I have  been so unhappy that I have been crying.  0    The thought of harming myself has occurred to me.  0    Edinburgh Postnatal Depression Scale Total  1        Assessment: 23 y.o. G3P1021 presenting   for 6 week postpartum visit  Plan: Problem List Items Addressed This Visit    None    Visit Diagnoses    6 weeks postpartum follow-up    -  Primary   Encounter for initial prescription of contraceptive pills           1) Contraception - Education given regarding options for contraception, as well as compatibility with breast feeding if applicable.  Patient plans on OCP (estrogen/progesterone) for contraception. - given 3 month samples of lo loestrin Fe  2)  Pap - ASCCP guidelines and rational discussed.  ASCCP guidelines and rational discussed.  Patient opts for every 3 years screening interval  3) Patient underwent screening for postpartum depression with no signs of depression  4) Return in about 1 year (around 06/13/2019) for annual.   Andreas Staebler, MD, FACOG [facility], [facility] 06/13/2018, 10:06 AM     

## 2019-02-24 NOTE — L&D Delivery Note (Signed)
Vaginal Delivery Note  Spontaneous delivery of live viable female infant from the ROA position through an intact perineum. Delivery of anterior left shoulder with gentle downward guidance followed by delivery of the right posterior shoulder with gentle upward guidance. Body followed spontaneously. Infant placed on maternal chest. Nursery present and helped with neonatal resuscitation and evaluation. Cord clamped and cut after one minute. Cord blood collected. Placenta delivered spontaneously and intact with a 3 vessel cord.  2nd degree laceration. Uterus firm and below umbilicus at the end of the delivery.  Mom and baby recovering in stable condition. Sponge and needle counts were correct at the end of the delivery.  APGARS: 1 minute: 8  5 minutes: 9   Weight: Pending Epidural: Yes  [name], CNM, MSN [facility], [facility] 01/29/20 11:43 PM

## 2019-06-26 ENCOUNTER — Other Ambulatory Visit: Payer: Self-pay

## 2019-06-26 ENCOUNTER — Ambulatory Visit (INDEPENDENT_AMBULATORY_CARE_PROVIDER_SITE_OTHER): Payer: Medicaid Other | Admitting: Obstetrics & Gynecology

## 2019-06-26 ENCOUNTER — Other Ambulatory Visit (HOSPITAL_COMMUNITY)
Admission: RE | Admit: 2019-06-26 | Discharge: 2019-06-26 | Disposition: A | Discharge status: Home / Self Care | Payer: Medicaid Other | Source: Ambulatory Visit | Attending: Obstetrics & Gynecology | Admitting: Obstetrics & Gynecology

## 2019-06-26 ENCOUNTER — Encounter: Payer: Self-pay | Admitting: Obstetrics & Gynecology

## 2019-06-26 VITALS — BP 120/80 | Wt 165.0 lb

## 2019-06-26 DIAGNOSIS — Z1379 Encounter for other screening for genetic and chromosomal anomalies: Secondary | ICD-10-CM

## 2019-06-26 DIAGNOSIS — Z3491 Encounter for supervision of normal pregnancy, unspecified, first trimester: Secondary | ICD-10-CM

## 2019-06-26 DIAGNOSIS — Z124 Encounter for screening for malignant neoplasm of cervix: Secondary | ICD-10-CM | POA: Insufficient documentation

## 2019-06-26 DIAGNOSIS — Z3201 Encounter for pregnancy test, result positive: Secondary | ICD-10-CM

## 2019-06-26 DIAGNOSIS — Z369 Encounter for antenatal screening, unspecified: Secondary | ICD-10-CM

## 2019-06-26 DIAGNOSIS — Z3480 Encounter for supervision of other normal pregnancy, unspecified trimester: Secondary | ICD-10-CM | POA: Insufficient documentation

## 2019-06-26 DIAGNOSIS — Z3A08 8 weeks gestation of pregnancy: Secondary | ICD-10-CM

## 2019-06-26 LAB — POCT URINE PREGNANCY: Preg Test, Ur: POSITIVE — AB

## 2019-06-26 LAB — POCT URINALYSIS DIPSTICK OB
Glucose, UA: NEGATIVE
POC,PROTEIN,UA: NEGATIVE

## 2019-06-26 NOTE — Progress Notes (Signed)
06/26/2019   Chief Complaint: Missed period  Transfer of Care Patient: no  History of Present Illness: Taylor Lucas is a 24 y.o. A2N0539 37w0dbased on Patient's last menstrual period was 05/01/2019. with an Estimated Date of Delivery: 02/05/20, with the above CC.   Her periods were: regular periods every 28 days She was using no method when she conceived.  She has Positive signs or symptoms of nausea/vomiting of pregnancy. She has Negative signs or symptoms of miscarriage or preterm labor She identifies Negative Zika risk factors for her and her partner On any different medications around the time she conceived/early pregnancy: No  History of varicella: Yes   ROS: A 12-point review of systems was performed and negative, except as stated in the above HPI.  OBGYN History: As per HPI. OB History  Gravida Para Term Preterm AB Living  '4 1 1   2 1  '$ SAB TAB Ectopic Multiple Live Births  1 1   0 1    # Outcome Date GA Lbr Len/2nd Weight Sex Delivery Anes PTL Lv  4 Current           3 Term 04/28/18 [redacted]w[redacted]d 00:12 5 lb 10 oz (2.55 kg) F Vag-Spont EPI  LIV  2 SAB           1 TAB             Any issues with any prior pregnancies: no Any prior children are healthy, doing well, without any problems or issues: yes History of pap smears: Yes. Last pap smear 2018. Abnormal: no  History of STIs: No   Past Medical History: Past Medical History:  Diagnosis Date  . Dysmenorrhea   . Family history of breast cancer    mom age [id number] neg; pt qualifies for update testing when older  . Rib fracture 10/2008   sports injury    Past Surgical History: Past Surgical History:  Procedure Laterality Date  . DILATION AND CURETTAGE OF UTERUS    . nexplanon     2015    Family History:  Family History  Problem Relation Age of Onset  . Breast cancer Mother [id number]     BRCA neg  . Hypertension Father   . Kidney Stones Father   . Renal cancer Paternal Grandmother   . Diabetes Paternal Grandfather    . Stroke Maternal Grandfather    She denies any female cancers, bleeding or blood clotting disorders.  She denies any history of mental retardation, birth defects or genetic disorders in her or the FOB's history  Social History:  Social History   Socioeconomic History  . Marital status: Married    Spouse name: MiLegrand Como. Number of children: 0  . Years of education: Not on file  . Highest education level: Not on file  Occupational History  . Not on file  Tobacco Use  . Smoking status: Never Smoker  . Smokeless tobacco: Never Used  Substance and Sexual Activity  . Alcohol use: Yes  . Drug use: No  . Sexual activity: Yes    Birth control/protection: None  Other Topics Concern  . Not on file  Social History Narrative  . Not on file   Social Determinants of Health   Financial Resource Strain:   . Difficulty of Paying Living Expenses:   Food Insecurity:   . Worried About RuCharity fundraisern the Last Year:   . RaCoultern the Last Year:  Transportation Needs:   . Film/video editor (Medical):   Marland Kitchen Lack of Transportation (Non-Medical):   Physical Activity:   . Days of Exercise per Week:   . Minutes of Exercise per Session:   Stress:   . Feeling of Stress :   Social Connections:   . Frequency of Communication with Friends and Family:   . Frequency of Social Gatherings with Friends and Family:   . Attends Religious Services:   . Active Member of Clubs or Organizations:   . Attends Archivist Meetings:   Marland Kitchen Marital Status:   Intimate Partner Violence:   . Fear of Current or Ex-Partner:   . Emotionally Abused:   Marland Kitchen Physically Abused:   . Sexually Abused:    Any pets in the household: no  Allergy: No Known Allergies  Current Outpatient Medications:  Current Outpatient Medications:  .  Prenatal Vit-Fe Fumarate-FA (MULTIVITAMIN-PRENATAL) 27-0.8 MG TABS tablet, Take 1 tablet by mouth daily at 12 noon., Disp: , Rfl:  .  ibuprofen (ADVIL,MOTRIN)  600 MG tablet, Take 1 tablet (600 mg total) by mouth every 6 (six) hours as needed. (Patient not taking: Reported on 06/13/2018), Disp: 30 tablet, Rfl: 0 .  Norethindrone-Ethinyl Estradiol-Fe Biphas (LO LOESTRIN FE) 1 MG-10 MCG / 10 MCG tablet, Take 1 tablet by mouth daily., Disp: 84 tablet, Rfl: 0   Physical Exam:   BP 120/80   Wt 165 lb (74.8 kg)   LMP 05/01/2019   BMI 31.69 kg/m  Body mass index is 31.69 kg/m. Constitutional: Well nourished, well developed female in no acute distress.  Neck:  Supple, normal appearance, and no thyromegaly  Cardiovascular: S1, S2 normal, no murmur, rub or gallop, regular rate and rhythm Respiratory:  Clear to auscultation bilateral. Normal respiratory effort Abdomen: positive bowel sounds and no masses, hernias; diffusely non tender to palpation, non distended Breasts: breasts appear normal, no suspicious masses, no skin or nipple changes or axillary nodes. Neuro/Psych:  Normal mood and affect.  Skin:  Warm and dry.  Lymphatic:  No inguinal lymphadenopathy.   Pelvic exam: is not limited by body habitus EGBUS: within normal limits, Vagina: within normal limits and with no blood in the vault, Cervix: normal appearing cervix without discharge or lesions, closed/long/high, Uterus:  enlarged: 8 wks, and Adnexa:  no mass, fullness, tenderness  Assessment: Taylor Lucas is a 24 y.o. [id number] 36w0dbased on Patient's last menstrual period was 05/01/2019. with an Estimated Date of Delivery: 02/05/20,  for prenatal care.  Plan:  1) Avoid alcoholic beverages. 2) Patient encouraged not to smoke.  3) Discontinue the use of all non-medicinal drugs and chemicals.  4) Take prenatal vitamins daily.  5) Seatbelt use advised 6) Nutrition, food safety (fish, cheese advisories, and high nitrite foods) and exercise discussed. 7) [facility]  8) Patient is asked about travel to areas at risk for the ZBeale AFBvirus, and counseled to  avoid travel and exposure to mosquitoes or sexual partners who may have themselves been exposed to the virus. Testing is discussed, and will be ordered as appropriate.  9) [facility] 10) Genetic Screening, such as with 1st Trimester Screening, cell free fetal DNA, AFP testing, and Ultrasound, as well as with amniocentesis and CVS as appropriate, is discussed with patient. She plans to have genetic testing this pregnancy. 11) [place]2 weeks w labs then  Problem list reviewed and updated.  PBarnett Applebaum MD, [facility], [facility] 06/26/2019  10:25 AM

## 2019-06-26 NOTE — Patient Instructions (Signed)
First Trimester of Pregnancy The first trimester of pregnancy is from week 1 until the end of week 13 (months 1 through 3). A week after a sperm fertilizes an egg, the egg will implant on the wall of the uterus. This embryo will begin to develop into a baby. Genes from you and your partner will form the baby. The female genes will determine whether the baby will be a boy or a girl. At 6-8 weeks, the eyes and face will be formed, and the heartbeat can be seen on ultrasound. At the end of 12 weeks, all the baby's organs will be formed. Now that you are pregnant, you will want to do everything you can to have a healthy baby. Two of the most important things are to get good prenatal care and to follow your health care provider's instructions. Prenatal care is all the medical care you receive before the baby's birth. This care will help prevent, find, and treat any problems during the pregnancy and childbirth. Body changes during your first trimester Your body goes through many changes during pregnancy. The changes vary from woman to woman.  You may gain or lose a couple of pounds at first.  You may feel sick to your stomach (nauseous) and you may throw up (vomit). If the vomiting is uncontrollable, call your health care provider.  You may tire easily.  You may develop headaches that can be relieved by medicines. All medicines should be approved by your health care provider.  You may urinate more often. Painful urination may mean you have a bladder infection.  You may develop heartburn as a result of your pregnancy.  You may develop constipation because certain hormones are causing the muscles that push stool through your intestines to slow down.  You may develop hemorrhoids or swollen veins (varicose veins).  Your breasts may begin to grow larger and become tender. Your nipples may stick out more, and the tissue that surrounds them (areola) may become darker.  Your gums may bleed and may be  sensitive to brushing and flossing.  Dark spots or blotches (chloasma, mask of pregnancy) may develop on your face. This will likely fade after the baby is born.  Your menstrual periods will stop.  You may have a loss of appetite.  You may develop cravings for certain kinds of food.  You may have changes in your emotions from day to day, such as being excited to be pregnant or being concerned that something may go wrong with the pregnancy and baby.  You may have more vivid and strange dreams.  You may have changes in your hair. These can include thickening of your hair, rapid growth, and changes in texture. Some women also have hair loss during or after pregnancy, or hair that feels dry or thin. Your hair will most likely return to normal after your baby is born. What to expect at prenatal visits During a routine prenatal visit:  You will be weighed to make sure you and the baby are growing normally.  Your blood pressure will be taken.  Your abdomen will be measured to track your baby's growth.  The fetal heartbeat will be listened to between weeks 10 and 14 of your pregnancy.  Test results from any previous visits will be discussed. Your health care provider may ask you:  How you are feeling.  If you are feeling the baby move.  If you have had any abnormal symptoms, such as leaking fluid, bleeding, severe headaches, or abdominal   cramping.  If you are using any tobacco products, including cigarettes, chewing tobacco, and electronic cigarettes.  If you have any questions. Other tests that may be performed during your first trimester include:  Blood tests to find your blood type and to check for the presence of any previous infections. The tests will also be used to check for low iron levels (anemia) and protein on red blood cells (Rh antibodies). Depending on your risk factors, or if you previously had diabetes during pregnancy, you may have tests to check for high blood sugar  that affects pregnant women (gestational diabetes).  Urine tests to check for infections, diabetes, or protein in the urine.  An ultrasound to confirm the proper growth and development of the baby.  Fetal screens for spinal cord problems (spina bifida) and Down syndrome.  HIV (human immunodeficiency virus) testing. Routine prenatal testing includes screening for HIV, unless you choose not to have this test.  You may need other tests to make sure you and the baby are doing well. Follow these instructions at home: Medicines  Follow your health care provider's instructions regarding medicine use. Specific medicines may be either safe or unsafe to take during pregnancy.  Take a prenatal vitamin that contains at least 600 micrograms (mcg) of folic acid.  If you develop constipation, try taking a stool softener if your health care provider approves. Eating and drinking   Eat a balanced diet that includes fresh fruits and vegetables, whole grains, good sources of protein such as meat, eggs, or tofu, and low-fat dairy. Your health care provider will help you determine the amount of weight gain that is right for you.  Avoid raw meat and uncooked cheese. These carry germs that can cause birth defects in the baby.  Eating four or five small meals rather than three large meals a day may help relieve nausea and vomiting. If you start to feel nauseous, eating a few soda crackers can be helpful. Drinking liquids between meals, instead of during meals, also seems to help ease nausea and vomiting.  Limit foods that are high in fat and processed sugars, such as fried and sweet foods.  To prevent constipation: ? Eat foods that are high in fiber, such as fresh fruits and vegetables, whole grains, and beans. ? Drink enough fluid to keep your urine clear or pale yellow. Activity  Exercise only as directed by your health care provider. Most women can continue their usual exercise routine during  pregnancy. Try to exercise for 30 minutes at least 5 days a week. Exercising will help you: ? Control your weight. ? Stay in shape. ? Be prepared for labor and delivery.  Experiencing pain or cramping in the lower abdomen or lower back is a good sign that you should stop exercising. Check with your health care provider before continuing with normal exercises.  Try to avoid standing for long periods of time. Move your legs often if you must stand in one place for a long time.  Avoid heavy lifting.  Wear low-heeled shoes and practice good posture.  You may continue to have sex unless your health care provider tells you not to. Relieving pain and discomfort  Wear a good support bra to relieve breast tenderness.  Take warm sitz baths to soothe any pain or discomfort caused by hemorrhoids. Use hemorrhoid cream if your health care provider approves.  Rest with your legs elevated if you have leg cramps or low back pain.  If you develop varicose veins in   your legs, wear support hose. Elevate your feet for 15 minutes, 3-4 times a day. Limit salt in your diet. Prenatal care  Schedule your prenatal visits by the twelfth week of pregnancy. They are usually scheduled monthly at first, then more often in the last 2 months before delivery.  Write down your questions. Take them to your prenatal visits.  Keep all your prenatal visits as told by your health care provider. This is important. Safety  Wear your seat belt at all times when driving.  Make a list of emergency phone numbers, including numbers for family, friends, the hospital, and police and fire departments. General instructions  Ask your health care provider for a referral to a local prenatal education class. Begin classes no later than the beginning of month 6 of your pregnancy.  Ask for help if you have counseling or nutritional needs during pregnancy. Your health care provider can offer advice or refer you to specialists for help  with various needs.  Do not use hot tubs, steam rooms, or saunas.  Do not douche or use tampons or scented sanitary pads.  Do not cross your legs for long periods of time.  Avoid cat litter boxes and soil used by cats. These carry germs that can cause birth defects in the baby and possibly loss of the fetus by miscarriage or stillbirth.  Avoid all smoking, herbs, alcohol, and medicines not prescribed by your health care provider. Chemicals in these products affect the formation and growth of the baby.  Do not use any products that contain nicotine or tobacco, such as cigarettes and e-cigarettes. If you need help quitting, ask your health care provider. You may receive counseling support and other resources to help you quit.  Schedule a dentist appointment. At home, brush your teeth with a soft toothbrush and be gentle when you floss. Contact a health care provider if:  You have dizziness.  You have mild pelvic cramps, pelvic pressure, or nagging pain in the abdominal area.  You have persistent nausea, vomiting, or diarrhea.  You have a bad smelling vaginal discharge.  You have pain when you urinate.  You notice increased swelling in your face, hands, legs, or ankles.  You are exposed to fifth disease or chickenpox.  You are exposed to German measles (rubella) and have never had it. Get help right away if:  You have a fever.  You are leaking fluid from your vagina.  You have spotting or bleeding from your vagina.  You have severe abdominal cramping or pain.  You have rapid weight gain or loss.  You vomit blood or material that looks like coffee grounds.  You develop a severe headache.  You have shortness of breath.  You have any kind of trauma, such as from a fall or a car accident. Summary  The first trimester of pregnancy is from week 1 until the end of week 13 (months 1 through 3).  Your body goes through many changes during pregnancy. The changes vary from  woman to woman.  You will have routine prenatal visits. During those visits, your health care provider will examine you, discuss any test results you may have, and talk with you about how you are feeling. This information is not intended to replace advice given to you by your health care provider. Make sure you discuss any questions you have with your health care provider. Document Revised: 01/22/2017 Document Reviewed: 01/22/2016 Elsevier Patient Education  2020 Elsevier Inc.  

## 2019-06-27 LAB — CYTOLOGY - PAP
Chlamydia: NEGATIVE
Comment: NEGATIVE
Comment: NORMAL
Diagnosis: NEGATIVE
Neisseria Gonorrhea: NEGATIVE

## 2019-06-28 LAB — URINE CULTURE

## 2019-07-10 ENCOUNTER — Ambulatory Visit (INDEPENDENT_AMBULATORY_CARE_PROVIDER_SITE_OTHER): Payer: Medicaid Other

## 2019-07-10 ENCOUNTER — Ambulatory Visit (INDEPENDENT_AMBULATORY_CARE_PROVIDER_SITE_OTHER): Payer: Medicaid Other | Admitting: Obstetrics and Gynecology

## 2019-07-10 ENCOUNTER — Other Ambulatory Visit: Payer: Self-pay | Admitting: Obstetrics & Gynecology

## 2019-07-10 ENCOUNTER — Other Ambulatory Visit: Payer: Self-pay

## 2019-07-10 VITALS — BP 120/78 | Wt 162.0 lb

## 2019-07-10 DIAGNOSIS — Z1379 Encounter for other screening for genetic and chromosomal anomalies: Secondary | ICD-10-CM | POA: Diagnosis not present

## 2019-07-10 DIAGNOSIS — Z3491 Encounter for supervision of normal pregnancy, unspecified, first trimester: Secondary | ICD-10-CM | POA: Diagnosis not present

## 2019-07-10 DIAGNOSIS — Z3A10 10 weeks gestation of pregnancy: Secondary | ICD-10-CM | POA: Diagnosis not present

## 2019-07-10 DIAGNOSIS — Z369 Encounter for antenatal screening, unspecified: Secondary | ICD-10-CM | POA: Diagnosis not present

## 2019-07-10 NOTE — Progress Notes (Signed)
    Routine Prenatal Care Visit  Subjective  Taylor Lucas is a 24 y.o. [id number] at [redacted]w[redacted]d being seen today for ongoing prenatal care.  She is currently monitored for the following issues for this low-risk pregnancy and has Vomiting affecting pregnancy, antepartum and Supervision of low-risk pregnancy, first trimester on their problem list.  ----------------------------------------------------------------------------------- Patient reports fatigue.  Contractions: Not present. Vag. Bleeding: None.  Movement: Absent. Denies leaking of fluid.  ----------------------------------------------------------------------------------- The following portions of the patient's history were reviewed and updated as appropriate: allergies, current medications, past family history, past medical history, past social history, past surgical history and problem list. Problem list updated.   Objective  Blood pressure 120/78, weight 162 lb (73.5 kg), last menstrual period 05/01/2019, not currently breastfeeding. Pregravid weight 167 lb (75.8 kg) Total Weight Gain -5 lb (-2.268 kg) Urinalysis:      Fetal Status: Fetal Heart Rate (bpm): 169   Movement: Absent     General:  Alert, oriented and cooperative. Patient is in no acute distress.  Skin: Skin is warm and dry. No rash noted.   Cardiovascular: Normal heart rate noted  Respiratory: Normal respiratory effort, no problems with respiration noted  Abdomen: Soft, gravid, appropriate for gestational age. Pain/Pressure: Absent     Pelvic:  Cervical exam deferred        Extremities: Normal range of motion.     Mental Status: Normal mood and affect. Normal behavior. Normal judgment and thought content.     Assessment   24 y.o. [id number] at [redacted]w[redacted]d by  02/05/2020, by Last Menstrual Period presenting for routine prenatal visit  Plan   pregnancy4  Problems (from 05/01/19 to present)    Problem Noted Resolved   Supervision of low-risk pregnancy, first trimester  06/26/2019 by Nadara Mustard, MD No   Overview Addendum 07/10/2019 11:12 AM by Natale Milch, MD    [facility]inic [facility] Prenatal Labs  Dating LMP = 10 WK [place] Blood type:     Genetic Screen    NIPS: Antibody:   Anatomic [place]  Rubella:   Varicella: @VZVIGG @  GTT Third trimester:  RPR:     Rhogam O+ HBsAg:     TDaP vaccine               Flu Shot: HIV:     Baby Food                                GBS:   Contraception  Pap: 06/26/19  CBB  No   CS/VBAC N/A   Support Person Husband 08/26/19              Dating Taylor Lucas today. Maternit21 testing today NOB labs today. Discussed COVID vaccination in pregnancy  Gestational age appropriate obstetric precautions including but not limited to vaginal bleeding, contractions, leaking of fluid and fetal movement were reviewed in detail with the patient.    Return in about 4 weeks (around 08/07/2019) for ROB in person.  08/09/2019 MD [facility], [facility] 07/10/2019, 11:13 AM

## 2019-07-10 NOTE — Patient Instructions (Signed)
First Trimester of Pregnancy The first trimester of pregnancy is from week 1 until the end of week 13 (months 1 through 3). A week after a sperm fertilizes an egg, the egg will implant on the wall of the uterus. This embryo will begin to develop into a baby. Genes from you and your partner will form the baby. The female genes will determine whether the baby will be a boy or a girl. At 6-8 weeks, the eyes and face will be formed, and the heartbeat can be seen on ultrasound. At the end of 12 weeks, all the baby's organs will be formed. Now that you are pregnant, you will want to do everything you can to have a healthy baby. Two of the most important things are to get good prenatal care and to follow your health care provider's instructions. Prenatal care is all the medical care you receive before the baby's birth. This care will help prevent, find, and treat any problems during the pregnancy and childbirth. Body changes during your first trimester Your body goes through many changes during pregnancy. The changes vary from woman to woman.  You may gain or lose a couple of pounds at first.  You may feel sick to your stomach (nauseous) and you may throw up (vomit). If the vomiting is uncontrollable, call your health care provider.  You may tire easily.  You may develop headaches that can be relieved by medicines. All medicines should be approved by your health care provider.  You may urinate more often. Painful urination may mean you have a bladder infection.  You may develop heartburn as a result of your pregnancy.  You may develop constipation because certain hormones are causing the muscles that push stool through your intestines to slow down.  You may develop hemorrhoids or swollen veins (varicose veins).  Your breasts may begin to grow larger and become tender. Your nipples may stick out more, and the tissue that surrounds them (areola) may become darker.  Your gums may bleed and may be  sensitive to brushing and flossing.  Dark spots or blotches (chloasma, mask of pregnancy) may develop on your face. This will likely fade after the baby is born.  Your menstrual periods will stop.  You may have a loss of appetite.  You may develop cravings for certain kinds of food.  You may have changes in your emotions from day to day, such as being excited to be pregnant or being concerned that something may go wrong with the pregnancy and baby.  You may have more vivid and strange dreams.  You may have changes in your hair. These can include thickening of your hair, rapid growth, and changes in texture. Some women also have hair loss during or after pregnancy, or hair that feels dry or thin. Your hair will most likely return to normal after your baby is born. What to expect at prenatal visits During a routine prenatal visit:  You will be weighed to make sure you and the baby are growing normally.  Your blood pressure will be taken.  Your abdomen will be measured to track your baby's growth.  The fetal heartbeat will be listened to between weeks 10 and 14 of your pregnancy.  Test results from any previous visits will be discussed. Your health care provider may ask you:  How you are feeling.  If you are feeling the baby move.  If you have had any abnormal symptoms, such as leaking fluid, bleeding, severe headaches, or abdominal   cramping.  If you are using any tobacco products, including cigarettes, chewing tobacco, and electronic cigarettes.  If you have any questions. Other tests that may be performed during your first trimester include:  Blood tests to find your blood type and to check for the presence of any previous infections. The tests will also be used to check for low iron levels (anemia) and protein on red blood cells (Rh antibodies). Depending on your risk factors, or if you previously had diabetes during pregnancy, you may have tests to check for high blood sugar  that affects pregnant women (gestational diabetes).  Urine tests to check for infections, diabetes, or protein in the urine.  An ultrasound to confirm the proper growth and development of the baby.  Fetal screens for spinal cord problems (spina bifida) and Down syndrome.  HIV (human immunodeficiency virus) testing. Routine prenatal testing includes screening for HIV, unless you choose not to have this test.  You may need other tests to make sure you and the baby are doing well. Follow these instructions at home: Medicines  Follow your health care provider's instructions regarding medicine use. Specific medicines may be either safe or unsafe to take during pregnancy.  Take a prenatal vitamin that contains at least 600 micrograms (mcg) of folic acid.  If you develop constipation, try taking a stool softener if your health care provider approves. Eating and drinking   Eat a balanced diet that includes fresh fruits and vegetables, whole grains, good sources of protein such as meat, eggs, or tofu, and low-fat dairy. Your health care provider will help you determine the amount of weight gain that is right for you.  Avoid raw meat and uncooked cheese. These carry germs that can cause birth defects in the baby.  Eating four or five small meals rather than three large meals a day may help relieve nausea and vomiting. If you start to feel nauseous, eating a few soda crackers can be helpful. Drinking liquids between meals, instead of during meals, also seems to help ease nausea and vomiting.  Limit foods that are high in fat and processed sugars, such as fried and sweet foods.  To prevent constipation: ? Eat foods that are high in fiber, such as fresh fruits and vegetables, whole grains, and beans. ? Drink enough fluid to keep your urine clear or pale yellow. Activity  Exercise only as directed by your health care provider. Most women can continue their usual exercise routine during  pregnancy. Try to exercise for 30 minutes at least 5 days a week. Exercising will help you: ? Control your weight. ? Stay in shape. ? Be prepared for labor and delivery.  Experiencing pain or cramping in the lower abdomen or lower back is a good sign that you should stop exercising. Check with your health care provider before continuing with normal exercises.  Try to avoid standing for long periods of time. Move your legs often if you must stand in one place for a long time.  Avoid heavy lifting.  Wear low-heeled shoes and practice good posture.  You may continue to have sex unless your health care provider tells you not to. Relieving pain and discomfort  Wear a good support bra to relieve breast tenderness.  Take warm sitz baths to soothe any pain or discomfort caused by hemorrhoids. Use hemorrhoid cream if your health care provider approves.  Rest with your legs elevated if you have leg cramps or low back pain.  If you develop varicose veins in   your legs, wear support hose. Elevate your feet for 15 minutes, 3-4 times a day. Limit salt in your diet. Prenatal care  Schedule your prenatal visits by the twelfth week of pregnancy. They are usually scheduled monthly at first, then more often in the last 2 months before delivery.  Write down your questions. Take them to your prenatal visits.  Keep all your prenatal visits as told by your health care provider. This is important. Safety  Wear your seat belt at all times when driving.  Make a list of emergency phone numbers, including numbers for family, friends, the hospital, and police and fire departments. General instructions  Ask your health care provider for a referral to a local prenatal education class. Begin classes no later than the beginning of month 6 of your pregnancy.  Ask for help if you have counseling or nutritional needs during pregnancy. Your health care provider can offer advice or refer you to specialists for help  with various needs.  Do not use hot tubs, steam rooms, or saunas.  Do not douche or use tampons or scented sanitary pads.  Do not cross your legs for long periods of time.  Avoid cat litter boxes and soil used by cats. These carry germs that can cause birth defects in the baby and possibly loss of the fetus by miscarriage or stillbirth.  Avoid all smoking, herbs, alcohol, and medicines not prescribed by your health care provider. Chemicals in these products affect the formation and growth of the baby.  Do not use any products that contain nicotine or tobacco, such as cigarettes and e-cigarettes. If you need help quitting, ask your health care provider. You may receive counseling support and other resources to help you quit.  Schedule a dentist appointment. At home, brush your teeth with a soft toothbrush and be gentle when you floss. Contact a health care provider if:  You have dizziness.  You have mild pelvic cramps, pelvic pressure, or nagging pain in the abdominal area.  You have persistent nausea, vomiting, or diarrhea.  You have a bad smelling vaginal discharge.  You have pain when you urinate.  You notice increased swelling in your face, hands, legs, or ankles.  You are exposed to fifth disease or chickenpox.  You are exposed to German measles (rubella) and have never had it. Get help right away if:  You have a fever.  You are leaking fluid from your vagina.  You have spotting or bleeding from your vagina.  You have severe abdominal cramping or pain.  You have rapid weight gain or loss.  You vomit blood or material that looks like coffee grounds.  You develop a severe headache.  You have shortness of breath.  You have any kind of trauma, such as from a fall or a car accident. Summary  The first trimester of pregnancy is from week 1 until the end of week 13 (months 1 through 3).  Your body goes through many changes during pregnancy. The changes vary from  woman to woman.  You will have routine prenatal visits. During those visits, your health care provider will examine you, discuss any test results you may have, and talk with you about how you are feeling. This information is not intended to replace advice given to you by your health care provider. Make sure you discuss any questions you have with your health care provider. Document Revised: 01/22/2017 Document Reviewed: 01/22/2016 Elsevier Patient Education  2020 Elsevier Inc.  

## 2019-07-11 LAB — RPR+RH+ABO+RUB AB+AB SCR+CB...
Antibody Screen: NEGATIVE
HIV Screen 4th Generation wRfx: NONREACTIVE
Hematocrit: 42.1 % (ref 34.0–46.6)
Hemoglobin: 14.4 g/dL (ref 11.1–15.9)
Hepatitis B Surface Ag: NEGATIVE
MCH: 31.5 pg (ref 26.6–33.0)
MCHC: 34.2 g/dL (ref 31.5–35.7)
MCV: 92 fL (ref 79–97)
Platelets: 252 10*3/uL (ref 150–450)
RBC: 4.57 x10E6/uL (ref 3.77–5.28)
RDW: 12.3 % (ref 11.7–15.4)
RPR Ser Ql: NONREACTIVE
Rh Factor: POSITIVE
Rubella Antibodies, IGG: 2.43 index (ref >0.99)
Varicella zoster IgG: 1045 index (ref >165)
WBC: 11.3 10*3/uL — ABNORMAL HIGH (ref 3.4–10.8)

## 2019-07-16 LAB — MATERNIT21 PLUS CORE+SCA
Fetal Fraction: 8
Monosomy X (Turner Syndrome): NOT DETECTED
Result (T21): NEGATIVE
Trisomy 13 (Patau syndrome): NEGATIVE
Trisomy 18 (Edwards syndrome): NEGATIVE
Trisomy 21 (Down syndrome): NEGATIVE
XXX (Triple X Syndrome): NOT DETECTED
XXY (Klinefelter Syndrome): NOT DETECTED
XYY (Jacobs Syndrome): NOT DETECTED

## 2019-07-17 NOTE — Progress Notes (Signed)
Called patient- left message to pick up gender envelope.

## 2019-07-17 NOTE — Progress Notes (Signed)
Could you please make this patient a gender envelope? Thank you!

## 2019-08-07 ENCOUNTER — Encounter: Payer: Self-pay | Admitting: Obstetrics & Gynecology

## 2019-08-07 ENCOUNTER — Other Ambulatory Visit: Payer: Self-pay

## 2019-08-07 ENCOUNTER — Ambulatory Visit (INDEPENDENT_AMBULATORY_CARE_PROVIDER_SITE_OTHER): Payer: Medicaid Other | Admitting: Obstetrics & Gynecology

## 2019-08-07 VITALS — BP 120/80 | Wt 161.0 lb

## 2019-08-07 DIAGNOSIS — Z3689 Encounter for other specified antenatal screening: Secondary | ICD-10-CM

## 2019-08-07 DIAGNOSIS — Z3482 Encounter for supervision of other normal pregnancy, second trimester: Secondary | ICD-10-CM

## 2019-08-07 DIAGNOSIS — Z3A14 14 weeks gestation of pregnancy: Secondary | ICD-10-CM

## 2019-08-07 LAB — POCT URINALYSIS DIPSTICK OB
Glucose, UA: NEGATIVE
POC,PROTEIN,UA: NEGATIVE

## 2019-08-07 NOTE — Progress Notes (Signed)
  Subjective  Fetal Movement? yes Contractions? no Nausea? no Vaginal Bleeding? no  Objective  BP 120/80   Wt 161 lb (73 kg)   LMP 05/01/2019   BMI 30.93 kg/m  General: NAD Pumonary: no increased work of breathing Abdomen: gravid, non-tender Extremities: no edema Psychiatric: mood appropriate, affect full  Assessment  24 y.o. [id number] at [redacted]w[redacted]d by  02/05/2020, by Last Menstrual Period presenting for routine prenatal visit  Plan   Problem List Items Addressed This Visit      Other   Supervision of low-risk pregnancy, first trimester    Other Visit Diagnoses    [redacted] weeks gestation of pregnancy    -  Primary   Screening, antenatal, for fetal anatomic survey          pregnancy4  Problems (from 05/01/19 to present)    Problem Noted Resolved   Supervision of low-risk pregnancy, first trimester 06/26/2019 by Nadara Mustard, MD No   Overview Addendum 08/07/2019  9:51 AM by Nadara Mustard, MD    [facility] Prenatal Labs  Dating LMP = 10 WK [place] Blood type: O/Positive/-- (05/17 1130)   Genetic Screen    NIPS:nml XY Antibody:Negative (05/17 1130)  Anatomic [place]  Rubella: 2.43 (05/17 1130) Varicella: @VZVIGG @  GTT Third trimester:  RPR: Non Reactive (05/17 1130)   Rhogam O+ HBsAg: Negative (05/17 1130)   TDaP vaccine               Flu Shot: HIV: Non Reactive (05/17 1130)   Baby Food                                GBS:   Contraception  Pap: 06/26/19 NIL  CBB  No   CS/VBAC N/A   Support Person Husband 08/26/19             Casimiro Needle nv  PNV  [place], MD, [facility], [facility] 08/07/2019  10:00 AM

## 2019-08-07 NOTE — Addendum Note (Signed)
Addended by: Cornelius Moras D on: 08/07/2019 10:03 AM   Modules accepted: Orders

## 2019-08-07 NOTE — Patient Instructions (Signed)

## 2019-08-24 DIAGNOSIS — Z419 Encounter for procedure for purposes other than remedying health state, unspecified: Secondary | ICD-10-CM | POA: Diagnosis not present

## 2019-09-12 ENCOUNTER — Other Ambulatory Visit: Payer: Self-pay | Admitting: Obstetrics

## 2019-09-12 ENCOUNTER — Ambulatory Visit (INDEPENDENT_AMBULATORY_CARE_PROVIDER_SITE_OTHER): Payer: Medicaid Other | Admitting: Obstetrics

## 2019-09-12 ENCOUNTER — Ambulatory Visit (INDEPENDENT_AMBULATORY_CARE_PROVIDER_SITE_OTHER): Payer: Medicaid Other

## 2019-09-12 ENCOUNTER — Other Ambulatory Visit: Payer: Self-pay

## 2019-09-12 VITALS — BP 128/70 | Ht 60.0 in | Wt 160.2 lb

## 2019-09-12 DIAGNOSIS — Z3482 Encounter for supervision of other normal pregnancy, second trimester: Secondary | ICD-10-CM

## 2019-09-12 DIAGNOSIS — Z3689 Encounter for other specified antenatal screening: Secondary | ICD-10-CM | POA: Diagnosis not present

## 2019-09-12 DIAGNOSIS — Z3491 Encounter for supervision of normal pregnancy, unspecified, first trimester: Secondary | ICD-10-CM

## 2019-09-12 DIAGNOSIS — Z3A19 19 weeks gestation of pregnancy: Secondary | ICD-10-CM

## 2019-09-12 LAB — POCT URINALYSIS DIPSTICK OB
Glucose, UA: NEGATIVE
POC,PROTEIN,UA: NEGATIVE

## 2019-09-12 NOTE — Progress Notes (Signed)
  Routine Prenatal Care Visit  Subjective  Taylor Lucas is a 24 y.o. [id number] at [redacted]w[redacted]d being seen today for ongoing prenatal care.  She is currently monitored for the following issues for this low-risk pregnancy and has Vomiting affecting pregnancy, antepartum and Supervision of low-risk pregnancy, first trimester on their problem list.  ----------------------------------------------------------------------------------- Patient reports no complaints.   Contractions: Not present. Vag. Bleeding: None.  Movement: Present. Leaking Fluid denies.  ----------------------------------------------------------------------------------- The following portions of the patient's history were reviewed and updated as appropriate: allergies, current medications, past family history, past medical history, past social history, past surgical history and problem list. Problem list updated.  Objective  Blood pressure 128/70, height 5' (1.524 m), weight 160 lb 3.2 oz (72.7 kg), last menstrual period 05/01/2019, not currently breastfeeding. Pregravid weight 167 lb (75.8 kg) Total Weight Gain -6 lb 12.8 oz (-3.084 kg) Urinalysis: Urine Protein    Urine Glucose    Fetal Status: Fetal Heart Rate (bpm): 154   Movement: Present     General:  Alert, oriented and cooperative. Patient is in no acute distress.  Skin: Skin is warm and dry. No rash noted.   Cardiovascular: Normal heart rate noted  Respiratory: Normal respiratory effort, no problems with respiration noted  Abdomen: Soft, gravid, appropriate for gestational age. Pain/Pressure: Absent     Pelvic:  Cervical exam deferred        Extremities: Normal range of motion.  Edema: None  Mental Status: Normal mood and affect. Normal behavior. Normal judgment and thought content.   Assessment   24 y.o. [id number] at [redacted]w[redacted]d by  02/05/2020, by Last Menstrual Period presenting for routine prenatal visit anatomy ultrasound normal female  Plan   pregnancy4  Problems (from  05/01/19 to present)    Problem Noted Resolved   Supervision of low-risk pregnancy, first trimester 06/26/2019 by Nadara Mustard, MD No   Overview Addendum 09/12/2019 10:42 AM by Mirna Mires, CNM    [facility]inic [facility] Prenatal Labs  Dating LMP = 10 WK [place] Blood type: O/Positive/-- (05/17 1130)   Genetic Screen    NIPS:nml XY Antibody:Negative (05/17 1130)  Anatomic [place]  normal anatomy Rubella: 2.43 (05/17 1130) Varicella: @VZVIGG @  GTT Third trimester:  RPR: Non Reactive (05/17 1130)   Rhogam O+ HBsAg: Negative (05/17 1130)   TDaP vaccine               Flu Shot: HIV: Non Reactive (05/17 1130)   Baby Food                                GBS:   Contraception  Pap: 06/26/19 NIL  CBB  No   CS/VBAC N/A   Support Person Husband 08/26/19           Previous Version       Preterm labor symptoms and general obstetric precautions including but not limited to vaginal bleeding, contractions, leaking of fluid and fetal movement were reviewed in detail with the patient. Please refer to After Visit Summary for other counseling recommendations.  Discussed ehr antomy ultrasound com;eted today.  Return in about 4 weeks (around 10/10/2019) for return OB.  10/12/2019, CNM  09/12/2019 10:56 AM

## 2019-09-24 DIAGNOSIS — Z419 Encounter for procedure for purposes other than remedying health state, unspecified: Secondary | ICD-10-CM | POA: Diagnosis not present

## 2019-10-10 ENCOUNTER — Encounter: Payer: Self-pay | Admitting: Obstetrics and Gynecology

## 2019-10-10 ENCOUNTER — Ambulatory Visit (INDEPENDENT_AMBULATORY_CARE_PROVIDER_SITE_OTHER): Payer: Medicaid Other | Admitting: Obstetrics and Gynecology

## 2019-10-10 ENCOUNTER — Other Ambulatory Visit: Payer: Self-pay

## 2019-10-10 DIAGNOSIS — Z3A23 23 weeks gestation of pregnancy: Secondary | ICD-10-CM

## 2019-10-10 DIAGNOSIS — Z3482 Encounter for supervision of other normal pregnancy, second trimester: Secondary | ICD-10-CM

## 2019-10-10 NOTE — Progress Notes (Signed)
Routine Prenatal Care Visit- Virtual Visit  Subjective   Virtual Visit via Telephone Note  I connected with Taylor Lucas on 10/10/19 at 10:10 AM EDT by telephone and verified that I am speaking with the correct person using two identifiers.   I discussed the limitations, risks, security and privacy concerns of performing an evaluation and management service by telephone and the availability of in person appointments. I also discussed with the patient that there may be a patient responsible charge related to this service. The patient expressed understanding and agreed to proceed.  The patient was at home I spoke with the patient from my  office The names of people involved in this encounter were: Taylor Lucas and Dr. Rejeana Brock D Colla is a 24 y.o. [phone] at [redacted]w[redacted]d being seen today for ongoing prenatal care.  She is currently monitored for the following issues for this low-risk pregnancy and has Vomiting affecting pregnancy, antepartum and Supervision of low-risk pregnancy, first trimester on their problem list.  ----------------------------------------------------------------------------------- Patient reports no complaints.   Contractions: Not present. Vag. Bleeding: None.  Movement: Present. Denies leaking of fluid.  ----------------------------------------------------------------------------------- The following portions of the patient's history were reviewed and updated as appropriate: allergies, current medications, past family history, past medical history, past social history, past surgical history and problem list. Problem list updated.   Objective  Last menstrual period 05/01/2019, not currently breastfeeding. Pregravid weight 167 lb (75.8 kg) Total Weight Gain -6 lb 12.8 oz (-3.084 kg) Urinalysis:      Fetal Status:     Movement: Present     Physical Exam could not be performed. Because of the COVID-19 outbreak this visit was performed over the phone and  not in person.   Assessment   24 y.o. [id number] at [redacted]w[redacted]d by  02/05/2020, by Last Menstrual Period presenting for routine prenatal visit  Plan   pregnancy4  Problems (from 05/01/19 to present)    Problem Noted Resolved   Supervision of low-risk pregnancy, first trimester 06/26/2019 by Nadara Mustard, MD No   Overview Addendum 09/14/2019  3:49 PM by Mirna Mires, CNM    [facility]inic [facility] Prenatal Labs  Dating LMP = 10 WK [place] Blood type: O/Positive/-- (05/17 1130)   Genetic Screen    NIPS:nml XY Antibody:Negative (05/17 1130)  Anatomic [place]  normal anatomy- female Rubella: 2.43 (05/17 1130) Varicella: @VZVIGG @  GTT Third trimester:  RPR: Non Reactive (05/17 1130)   Rhogam O+ HBsAg: Negative (05/17 1130)   TDaP vaccine               Flu Shot: HIV: Non Reactive (05/17 1130)   Baby Food                                GBS:   Contraception  Pap: 06/26/19 NIL  CBB  No   CS/VBAC N/A   Support Person Husband 08/26/19           Previous Version       Encouraged Covid vaccination- patient declining during pregnancy  Gestational age appropriate obstetric precautions including but not limited to vaginal bleeding, contractions, leaking of fluid and fetal movement were reviewed in detail with the patient.    Follow Up Instructions: 4 weeks or as needed   I discussed the assessment and treatment plan with the patient. The patient was provided an opportunity to ask questions and all were answered. The  patient agreed with the plan and demonstrated an understanding of the instructions.   The patient was advised to call back or seek an in-person evaluation if the symptoms worsen or if the condition fails to improve as anticipated.  I provided 5 minutes of non-face-to-face time during this encounter.  Return in about 4 weeks (around 11/07/2019) for ROB in person.  Adelene Idler MD [facility], [facility] 10/10/2019 11:19 AM

## 2019-10-10 NOTE — Patient Instructions (Addendum)
Third Trimester of Pregnancy The third trimester is from week 28 through week 40 (months 7 through 9). The third trimester is a time when the unborn baby (fetus) is growing rapidly. At the end of the ninth month, the fetus is about 20 inches in length and weighs 6-10 pounds. Body changes during your third trimester Your body will continue to go through many changes during pregnancy. The changes vary from woman to woman. During the third trimester:  Your weight will continue to increase. You can expect to gain 25-35 pounds (11-16 kg) by the end of the pregnancy.  You may begin to get stretch marks on your hips, abdomen, and breasts.  You may urinate more often because the fetus is moving lower into your pelvis and pressing on your bladder.  You may develop or continue to have heartburn. This is caused by increased hormones that slow down muscles in the digestive tract.  You may develop or continue to have constipation because increased hormones slow digestion and cause the muscles that push waste through your intestines to relax.  You may develop hemorrhoids. These are swollen veins (varicose veins) in the rectum that can itch or be painful.  You may develop swollen, bulging veins (varicose veins) in your legs.  You may have increased body aches in the pelvis, back, or thighs. This is due to weight gain and increased hormones that are relaxing your joints.  You may have changes in your hair. These can include thickening of your hair, rapid growth, and changes in texture. Some women also have hair loss during or after pregnancy, or hair that feels dry or thin. Your hair will most likely return to normal after your baby is born.  Your breasts will continue to grow and they will continue to become tender. A yellow fluid (colostrum) may leak from your breasts. This is the first milk you are producing for your baby.  Your belly button may stick out.  You may notice more swelling in your hands,  face, or ankles.  You may have increased tingling or numbness in your hands, arms, and legs. The skin on your belly may also feel numb.  You may feel short of breath because of your expanding uterus.  You may have more problems sleeping. This can be caused by the size of your belly, increased need to urinate, and an increase in your body's metabolism.  You may notice the fetus "dropping," or moving lower in your abdomen (lightening).  You may have increased vaginal discharge.  You may notice your joints feel loose and you may have pain around your pelvic bone. What to expect at prenatal visits You will have prenatal exams every 2 weeks until week 36. Then you will have weekly prenatal exams. During a routine prenatal visit:  You will be weighed to make sure you and the baby are growing normally.  Your blood pressure will be taken.  Your abdomen will be measured to track your baby's growth.  The fetal heartbeat will be listened to.  Any test results from the previous visit will be discussed.  You may have a cervical check near your due date to see if your cervix has softened or thinned (effaced).  You will be tested for Group B streptococcus. This happens between 35 and 37 weeks. Your health care provider may ask you:  What your birth plan is.  How you are feeling.  If you are feeling the baby move.  If you have had any abnormal   symptoms, such as leaking fluid, bleeding, severe headaches, or abdominal cramping.  If you are using any tobacco products, including cigarettes, chewing tobacco, and electronic cigarettes.  If you have any questions. Other tests or screenings that may be performed during your third trimester include:  Blood tests that check for low iron levels (anemia).  Fetal testing to check the health, activity level, and growth of the fetus. Testing is done if you have certain medical conditions or if there are problems during the pregnancy.  Nonstress test  (NST). This test checks the health of your baby to make sure there are no signs of problems, such as the baby not getting enough oxygen. During this test, a belt is placed around your belly. The baby is made to move, and its heart rate is monitored during movement. What is false labor? False labor is a condition in which you feel small, irregular tightenings of the muscles in the womb (contractions) that usually go away with rest, changing position, or drinking water. These are called Braxton Hicks contractions. Contractions may last for hours, days, or even weeks before true labor sets in. If contractions come at regular intervals, become more frequent, increase in intensity, or become painful, you should see your health care provider. What are the signs of labor?  Abdominal cramps.  Regular contractions that start at 10 minutes apart and become stronger and more frequent with time.  Contractions that start on the top of the uterus and spread down to the lower abdomen and back.  Increased pelvic pressure and dull back pain.  A watery or bloody mucus discharge that comes from the vagina.  Leaking of amniotic fluid. This is also known as your "water breaking." It could be a slow trickle or a gush. Let your health care provider know if it has a color or strange odor. If you have any of these signs, call your health care provider right away, even if it is before your due date. Follow these instructions at home: Medicines  Follow your health care provider's instructions regarding medicine use. Specific medicines may be either safe or unsafe to take during pregnancy.  Take a prenatal vitamin that contains at least 600 micrograms (mcg) of folic acid.  If you develop constipation, try taking a stool softener if your health care provider approves. Eating and drinking   Eat a balanced diet that includes fresh fruits and vegetables, whole grains, good sources of protein such as meat, eggs, or tofu,  and low-fat dairy. Your health care provider will help you determine the amount of weight gain that is right for you.  Avoid raw meat and uncooked cheese. These carry germs that can cause birth defects in the baby.  If you have low calcium intake from food, talk to your health care provider about whether you should take a daily calcium supplement.  Eat four or five small meals rather than three large meals a day.  Limit foods that are high in fat and processed sugars, such as fried and sweet foods.  To prevent constipation: ? Drink enough fluid to keep your urine clear or pale yellow. ? Eat foods that are high in fiber, such as fresh fruits and vegetables, whole grains, and beans. Activity  Exercise only as directed by your health care provider. Most women can continue their usual exercise routine during pregnancy. Try to exercise for 30 minutes at least 5 days a week. Stop exercising if you experience uterine contractions.  Avoid heavy lifting.  Do   not exercise in extreme heat or humidity, or at high altitudes.  Wear low-heel, comfortable shoes.  Practice good posture.  You may continue to have sex unless your health care provider tells you otherwise. Relieving pain and discomfort  Take frequent breaks and rest with your legs elevated if you have leg cramps or low back pain.  Take warm sitz baths to soothe any pain or discomfort caused by hemorrhoids. Use hemorrhoid cream if your health care provider approves.  Wear a good support bra to prevent discomfort from breast tenderness.  If you develop varicose veins: ? Wear support pantyhose or compression stockings as told by your healthcare provider. ? Elevate your feet for 15 minutes, 3-4 times a day. Prenatal care  Write down your questions. Take them to your prenatal visits.  Keep all your prenatal visits as told by your health care provider. This is important. Safety  Wear your seat belt at all times when driving.  Make  a list of emergency phone numbers, including numbers for family, friends, the hospital, and police and fire departments. General instructions  Avoid cat litter boxes and soil used by cats. These carry germs that can cause birth defects in the baby. If you have a cat, ask someone to clean the litter box for you.  Do not travel far distances unless it is absolutely necessary and only with the approval of your health care provider.  Do not use hot tubs, steam rooms, or saunas.  Do not drink alcohol.  Do not use any products that contain nicotine or tobacco, such as cigarettes and e-cigarettes. If you need help quitting, ask your health care provider.  Do not use any medicinal herbs or unprescribed drugs. These chemicals affect the formation and growth of the baby.  Do not douche or use tampons or scented sanitary pads.  Do not cross your legs for long periods of time.  To prepare for the arrival of your baby: ? Take prenatal classes to understand, practice, and ask questions about labor and delivery. ? Make a trial run to the hospital. ? Visit the hospital and tour the maternity area. ? Arrange for maternity or paternity leave through employers. ? Arrange for family and friends to take care of pets while you are in the hospital. ? Purchase a rear-facing car seat and make sure you know how to install it in your car. ? Pack your hospital bag. ? Prepare the baby's nursery. Make sure to remove all pillows and stuffed animals from the baby's crib to prevent suffocation.  Visit your dentist if you have not gone during your pregnancy. Use a soft toothbrush to brush your teeth and be gentle when you floss. Contact a health care provider if:  You are unsure if you are in labor or if your water has broken.  You become dizzy.  You have mild pelvic cramps, pelvic pressure, or nagging pain in your abdominal area.  You have lower back pain.  You have persistent nausea, vomiting, or  diarrhea.  You have an unusual or bad smelling vaginal discharge.  You have pain when you urinate. Get help right away if:  Your water breaks before 37 weeks.  You have regular contractions less than 5 minutes apart before 37 weeks.  You have a fever.  You are leaking fluid from your vagina.  You have spotting or bleeding from your vagina.  You have severe abdominal pain or cramping.  You have rapid weight loss or weight gain.  You have   shortness of breath with chest pain.  You notice sudden or extreme swelling of your face, hands, ankles, feet, or legs.  Your baby makes fewer than 10 movements in 2 hours.  You have severe headaches that do not go away when you take medicine.  You have vision changes. Summary  The third trimester is from week 28 through week 40, months 7 through 9. The third trimester is a time when the unborn baby (fetus) is growing rapidly.  During the third trimester, your discomfort may increase as you and your baby continue to gain weight. You may have abdominal, leg, and back pain, sleeping problems, and an increased need to urinate.  During the third trimester your breasts will keep growing and they will continue to become tender. A yellow fluid (colostrum) may leak from your breasts. This is the first milk you are producing for your baby.  False labor is a condition in which you feel small, irregular tightenings of the muscles in the womb (contractions) that eventually go away. These are called Braxton Hicks contractions. Contractions may last for hours, days, or even weeks before true labor sets in.  Signs of labor can include: abdominal cramps; regular contractions that start at 10 minutes apart and become stronger and more frequent with time; watery or bloody mucus discharge that comes from the vagina; increased pelvic pressure and dull back pain; and leaking of amniotic fluid. This information is not intended to replace advice given to you by your  health care provider. Make sure you discuss any questions you have with your health care provider. Document Revised: 06/02/2018 Document Reviewed: 03/17/2016 Elsevier Patient Education  2020 [employer]  

## 2019-10-25 DIAGNOSIS — Z419 Encounter for procedure for purposes other than remedying health state, unspecified: Secondary | ICD-10-CM | POA: Diagnosis not present

## 2019-11-14 ENCOUNTER — Other Ambulatory Visit: Payer: Self-pay

## 2019-11-14 ENCOUNTER — Ambulatory Visit (INDEPENDENT_AMBULATORY_CARE_PROVIDER_SITE_OTHER): Payer: Medicaid Other | Admitting: Obstetrics and Gynecology

## 2019-11-14 VITALS — BP 116/76 | Wt 171.0 lb

## 2019-11-14 DIAGNOSIS — Z3491 Encounter for supervision of normal pregnancy, unspecified, first trimester: Secondary | ICD-10-CM

## 2019-11-14 DIAGNOSIS — Z3A28 28 weeks gestation of pregnancy: Secondary | ICD-10-CM

## 2019-11-14 LAB — POCT URINALYSIS DIPSTICK OB
Glucose, UA: NEGATIVE
POC,PROTEIN,UA: NEGATIVE

## 2019-11-14 NOTE — Addendum Note (Signed)
Addended by: [place], Jr Milliron B on: 11/14/2019 11:07 AM   Modules accepted: Orders

## 2019-11-14 NOTE — Progress Notes (Signed)
    Routine Prenatal Care Visit  Subjective  Taylor Lucas is a 24 y.o. [id number] at [redacted]w[redacted]d being seen today for ongoing prenatal care.  She is currently monitored for the following issues for this low-risk pregnancy and has Vomiting affecting pregnancy, antepartum and Supervision of low-risk pregnancy, first trimester on their problem list.  ----------------------------------------------------------------------------------- Patient reports no complaints.   Contractions: Not present. Vag. Bleeding: None.  Movement: Present. Denies leaking of fluid.  ----------------------------------------------------------------------------------- The following portions of the patient's history were reviewed and updated as appropriate: allergies, current medications, past family history, past medical history, past social history, past surgical history and problem list. Problem list updated.   Objective  Blood pressure 116/76, weight 171 lb (77.6 kg), last menstrual period 05/01/2019, not currently breastfeeding. Pregravid weight 167 lb (75.8 kg) Total Weight Gain 4 lb (1.814 kg)  Body mass index is 33.4 kg/m.  Urinalysis:      Fetal Status: Fetal Heart Rate (bpm): 140 Fundal Height: 28 cm Movement: Present     General:  Alert, oriented and cooperative. Patient is in no acute distress.  Skin: Skin is warm and dry. No rash noted.   Cardiovascular: Normal heart rate noted  Respiratory: Normal respiratory effort, no problems with respiration noted  Abdomen: Soft, gravid, appropriate for gestational age. Pain/Pressure: Absent     Pelvic:  Cervical exam deferred        Extremities: Normal range of motion.     ental Status: Normal mood and affect. Normal behavior. Normal judgment and thought content.     Assessment   24 y.o. [id number] at [redacted]w[redacted]d by  02/05/2020, by Last Menstrual Period presenting for work-in prenatal visit  Plan   pregnancy4  Problems (from 05/01/19 to present)    Problem Noted  Resolved   Supervision of low-risk pregnancy, first trimester 06/26/2019 by Nadara Mustard, MD No   Overview Addendum 09/14/2019  3:49 PM by Mirna Mires, CNM    [facility]inic [facility] Prenatal Labs  Dating LMP = 10 WK [place] Blood type: O/Positive/-- (05/17 1130)   Genetic Screen    NIPS:nml XY Antibody:Negative (05/17 1130)  Anatomic [place]  normal anatomy- female Rubella: 2.43 (05/17 1130) Varicella: @VZVIGG @  GTT Third trimester:  RPR: Non Reactive (05/17 1130)   Rhogam O+ HBsAg: Negative (05/17 1130)   TDaP vaccine               Flu Shot: HIV: Non Reactive (05/17 1130)   Baby Food                                GBS:   Contraception  Pap: 06/26/19 NIL  CBB  No   CS/VBAC N/A   Support Person Husband 08/26/19           Previous Version       Gestational age appropriate obstetric precautions including but not limited to vaginal bleeding, contractions, leaking of fluid and fetal movement were reviewed in detail with the patient.    Return in about 2 weeks (around 11/28/2019) for ROB and 28 week labs.  01/28/2020, MD, [facility], [facility] 11/14/2019, 10:44 AM

## 2019-11-14 NOTE — Progress Notes (Signed)
ROB

## 2019-11-24 DIAGNOSIS — Z419 Encounter for procedure for purposes other than remedying health state, unspecified: Secondary | ICD-10-CM | POA: Diagnosis not present

## 2019-11-28 ENCOUNTER — Other Ambulatory Visit: Payer: Self-pay

## 2019-11-28 ENCOUNTER — Other Ambulatory Visit: Payer: Medicaid Other

## 2019-11-28 ENCOUNTER — Ambulatory Visit (INDEPENDENT_AMBULATORY_CARE_PROVIDER_SITE_OTHER): Payer: Medicaid Other | Admitting: Obstetrics and Gynecology

## 2019-11-28 ENCOUNTER — Encounter: Payer: Self-pay | Admitting: Obstetrics and Gynecology

## 2019-11-28 VITALS — BP 114/70 | Wt 176.0 lb

## 2019-11-28 DIAGNOSIS — Z131 Encounter for screening for diabetes mellitus: Secondary | ICD-10-CM

## 2019-11-28 DIAGNOSIS — Z3483 Encounter for supervision of other normal pregnancy, third trimester: Secondary | ICD-10-CM

## 2019-11-28 DIAGNOSIS — Z113 Encounter for screening for infections with a predominantly sexual mode of transmission: Secondary | ICD-10-CM

## 2019-11-28 DIAGNOSIS — Z3A30 30 weeks gestation of pregnancy: Secondary | ICD-10-CM

## 2019-11-28 NOTE — Progress Notes (Signed)
  Routine Prenatal Care Visit  Subjective  Taylor Lucas is a 24 y.o. [id number] at [redacted]w[redacted]d being seen today for ongoing prenatal care.  She is currently monitored for the following issues for this low-risk pregnancy and has Vomiting affecting pregnancy, antepartum and Supervision of other normal pregnancy, antepartum on their problem list.  ----------------------------------------------------------------------------------- Patient reports no complaints.   Contractions: Not present. Vag. Bleeding: None.  Movement: Present. Leaking Fluid denies.  ----------------------------------------------------------------------------------- The following portions of the patient's history were reviewed and updated as appropriate: allergies, current medications, past family history, past medical history, past social history, past surgical history and problem list. Problem list updated.  Objective  Blood pressure 114/70, weight 176 lb (79.8 kg), last menstrual period 05/01/2019, not currently breastfeeding. Pregravid weight 167 lb (75.8 kg) Total Weight Gain 9 lb (4.082 kg) Urinalysis: Urine Protein    Urine Glucose    Fetal Status: Fetal Heart Rate (bpm): 140 Fundal Height: 30 cm Movement: Present     General:  Alert, oriented and cooperative. Patient is in no acute distress.  Skin: Skin is warm and dry. No rash noted.   Cardiovascular: Normal heart rate noted  Respiratory: Normal respiratory effort, no problems with respiration noted  Abdomen: Soft, gravid, appropriate for gestational age. Pain/Pressure: Absent     Pelvic:  Cervical exam deferred        Extremities: Normal range of motion.     Mental Status: Normal mood and affect. Normal behavior. Normal judgment and thought content.   Assessment   24 y.o. [id number] at [redacted]w[redacted]d by  02/05/2020, by Last Menstrual Period presenting for routine prenatal visit  Plan   pregnancy4  Problems (from 05/01/19 to present)    Problem Noted Resolved   Supervision  of other normal pregnancy, antepartum 06/26/2019 by Nadara Mustard, MD No   Overview Addendum 09/14/2019  3:49 PM by Mirna Mires, CNM    Clinic [facility] Prenatal Labs  Dating LMP = 10 WK [place] Blood type: O/Positive/-- (05/17 1130)   Genetic Screen    NIPS:nml XY Antibody:Negative (05/17 1130)  Anatomic [place]  normal anatomy- female Rubella: 2.43 (05/17 1130) Varicella: @VZVIGG @  GTT Third trimester:  RPR: Non Reactive (05/17 1130)   Rhogam O+ HBsAg: Negative (05/17 1130)   TDaP vaccine               Flu Shot: HIV: Non Reactive (05/17 1130)   Baby Food                                GBS:   Contraception  Pap: 06/26/19 NIL  CBB  No   CS/VBAC N/A   Support Person Husband 08/26/19           Previous Version       Preterm labor symptoms and general obstetric precautions including but not limited to vaginal bleeding, contractions, leaking of fluid and fetal movement were reviewed in detail with the patient. Please refer to After Visit Summary for other counseling recommendations.   - 28 wk labs today  Return in about 2 weeks (around 12/12/2019) for Routine Prenatal Appointment.  12/14/2019, MD, [facility], [facility] 11/28/2019 10:48 AM

## 2019-11-29 LAB — 28 WEEK RH+PANEL
Basophils Absolute: 0 10*3/uL (ref 0.0–0.2)
Basos: 0 %
EOS (ABSOLUTE): 0 10*3/uL (ref 0.0–0.4)
Eos: 0 %
Gestational Diabetes Screen: 113 mg/dL (ref 65–139)
HIV Screen 4th Generation wRfx: NONREACTIVE
Hematocrit: 35.8 % (ref 34.0–46.6)
Hemoglobin: 12.4 g/dL (ref 11.1–15.9)
Immature Grans (Abs): 0.1 10*3/uL (ref 0.0–0.1)
Immature Granulocytes: 1 %
Lymphocytes Absolute: 1.4 10*3/uL (ref 0.7–3.1)
Lymphs: 13 %
MCH: 31.9 pg (ref 26.6–33.0)
MCHC: 34.6 g/dL (ref 31.5–35.7)
MCV: 92 fL (ref 79–97)
Monocytes Absolute: 0.7 10*3/uL (ref 0.1–0.9)
Monocytes: 6 %
Neutrophils Absolute: 9.1 10*3/uL — ABNORMAL HIGH (ref 1.4–7.0)
Neutrophils: 80 %
Platelets: 196 10*3/uL (ref 150–450)
RBC: 3.89 x10E6/uL (ref 3.77–5.28)
RDW: 12.2 % (ref 11.7–15.4)
RPR Ser Ql: NONREACTIVE
WBC: 11.3 10*3/uL — ABNORMAL HIGH (ref 3.4–10.8)

## 2019-12-12 ENCOUNTER — Other Ambulatory Visit: Payer: Self-pay

## 2019-12-12 ENCOUNTER — Ambulatory Visit (INDEPENDENT_AMBULATORY_CARE_PROVIDER_SITE_OTHER): Payer: BC Managed Care – PPO | Admitting: Obstetrics and Gynecology

## 2019-12-12 DIAGNOSIS — Z3480 Encounter for supervision of other normal pregnancy, unspecified trimester: Secondary | ICD-10-CM

## 2019-12-12 DIAGNOSIS — Z3A32 32 weeks gestation of pregnancy: Secondary | ICD-10-CM

## 2019-12-12 NOTE — Progress Notes (Signed)
    Routine Prenatal Care Visit  Subjective  Taylor Lucas is a 24 y.o. [id number] at [redacted]w[redacted]d being seen today for ongoing prenatal care.  She is currently monitored for the following issues for this low-risk pregnancy and has Vomiting affecting pregnancy, antepartum and Supervision of other normal pregnancy, antepartum on their problem list.  ----------------------------------------------------------------------------------- Patient reports no complaints.   Contractions: Not present. Vag. Bleeding: None.  Movement: Present. Denies leaking of fluid.  ----------------------------------------------------------------------------------- The following portions of the patient's history were reviewed and updated as appropriate: allergies, current medications, past family history, past medical history, past social history, past surgical history and problem list. Problem list updated.   Objective  Blood pressure 114/70, height 5' (1.524 m), weight 177 lb (80.3 kg), last menstrual period 05/01/2019, not currently breastfeeding. Pregravid weight 167 lb (75.8 kg) Total Weight Gain 10 lb (4.536 kg) Urinalysis:      Fetal Status: Fetal Heart Rate (bpm): 135 Fundal Height: 31 cm Movement: Present  Presentation: Vertex  General:  Alert, oriented and cooperative. Patient is in no acute distress.  Skin: Skin is warm and dry. No rash noted.   Cardiovascular: Normal heart rate noted  Respiratory: Normal respiratory effort, no problems with respiration noted  Abdomen: Soft, gravid, appropriate for gestational age. Pain/Pressure: Absent     Pelvic:  Cervical exam deferred        Extremities: Normal range of motion.     ental Status: Normal mood and affect. Normal behavior. Normal judgment and thought content.     Assessment   24 y.o. [id number] at [redacted]w[redacted]d by  02/05/2020, by Last Menstrual Period presenting for routine prenatal visit  Plan   pregnancy4  Problems (from 05/01/19 to present)    Problem Noted  Resolved   Supervision of other normal pregnancy, antepartum 06/26/2019 by Nadara Mustard, MD No   Overview Addendum 09/14/2019  3:49 PM by Mirna Mires, CNM    [facility]inic [facility] Prenatal Labs  Dating LMP = 10 WK [place] Blood type: O/Positive/-- (05/17 1130)   Genetic Screen    NIPS:nml XY Antibody:Negative (05/17 1130)  Anatomic [place]  normal anatomy- female Rubella: 2.43 (05/17 1130) Varicella: @VZVIGG @  GTT Third trimester:  RPR: Non Reactive (05/17 1130)   Rhogam O+ HBsAg: Negative (05/17 1130)   TDaP vaccine               Flu Shot: HIV: Non Reactive (05/17 1130)   Baby Food                                GBS:   Contraception  Pap: 06/26/19 NIL  CBB  No   CS/VBAC N/A   Support Person Husband 08/26/19           Previous Version       Gestational age appropriate obstetric precautions including but not limited to vaginal bleeding, contractions, leaking of fluid and fetal movement were reviewed in detail with the patient.    Return in about 2 weeks (around 12/26/2019) for ROB.  13/03/2019, MD, Vena [facility], [facility] 12/12/2019, 11:29 AM

## 2019-12-25 ENCOUNTER — Encounter: Payer: Self-pay | Admitting: Obstetrics & Gynecology

## 2019-12-25 ENCOUNTER — Ambulatory Visit (INDEPENDENT_AMBULATORY_CARE_PROVIDER_SITE_OTHER): Payer: BC Managed Care – PPO | Admitting: Obstetrics & Gynecology

## 2019-12-25 ENCOUNTER — Other Ambulatory Visit: Payer: Self-pay

## 2019-12-25 VITALS — BP 122/80

## 2019-12-25 DIAGNOSIS — Z419 Encounter for procedure for purposes other than remedying health state, unspecified: Secondary | ICD-10-CM | POA: Diagnosis not present

## 2019-12-25 DIAGNOSIS — Z3483 Encounter for supervision of other normal pregnancy, third trimester: Secondary | ICD-10-CM

## 2019-12-25 DIAGNOSIS — Z3A34 34 weeks gestation of pregnancy: Secondary | ICD-10-CM

## 2019-12-25 DIAGNOSIS — Z23 Encounter for immunization: Secondary | ICD-10-CM

## 2019-12-25 LAB — POCT URINALYSIS DIPSTICK OB
Glucose, UA: NEGATIVE
POC,PROTEIN,UA: NEGATIVE

## 2019-12-25 NOTE — Addendum Note (Signed)
Addended by: Cornelius Moras D on: 12/25/2019 02:15 PM   Modules accepted: Orders

## 2019-12-25 NOTE — Patient Instructions (Addendum)
Thank you for choosing [facility]. As part of our ongoing efforts to improve patient experience, we would appreciate your feedback. Please fill out the short survey that you will receive by mail or MyChart. Your opinion is important to [place]! -Dr Tiburcio Pea  Influenza (Flu) Vaccine (Inactivated or Recombinant): What You Need to Know 1. Why get vaccinated? Influenza vaccine can prevent influenza (flu). Flu is a contagious disease that spreads around the [place] every year, usually between October and May. Anyone can get the flu, but it is more dangerous for some people. Infants and young children, people 33 years of age and older, pregnant women, and people with certain health conditions or a weakened immune system are at greatest risk of flu complications. Pneumonia, bronchitis, sinus infections and ear infections are examples of flu-related complications. If you have a medical condition, such as heart disease, cancer or diabetes, flu can make it worse. Flu can cause fever and chills, sore throat, muscle aches, fatigue, cough, headache, and runny or stuffy nose. Some people may have vomiting and diarrhea, though this is more common in children than adults. Each year thousands of people in the [place] die from flu, and many more are hospitalized. Flu vaccine prevents millions of illnesses and flu-related visits to the doctor each year. 2. Influenza vaccine CDC recommends everyone 31 months of age and older get vaccinated every flu season. Children 6 months through 9 years of age may need 2 doses during a single flu season. Everyone else needs only 1 dose each flu season. It takes about 2 weeks for protection to develop after vaccination. There are many flu viruses, and they are always changing. Each year a new flu vaccine is made to protect against three or four viruses that are likely to cause disease in the upcoming flu season. Even when the vaccine doesn't exactly match these viruses, it may  still provide some protection. Influenza vaccine does not cause flu. Influenza vaccine may be given at the same time as other vaccines. 3. Talk with your health care provider Tell your vaccine provider if the person getting the vaccine:  Has had an allergic reaction after a previous dose of influenza vaccine, or has any severe, life-threatening allergies.  Has ever had Guillain-Barr Syndrome (also called GBS). In some cases, your health care provider may decide to postpone influenza vaccination to a future visit. People with minor illnesses, such as a cold, may be vaccinated. People who are moderately or severely ill should usually wait until they recover before getting influenza vaccine. Your health care provider can give you more information. 4. Risks of a vaccine reaction  Soreness, redness, and swelling where shot is given, fever, muscle aches, and headache can happen after influenza vaccine.  There may be a very small increased risk of Guillain-Barr Syndrome (GBS) after inactivated influenza vaccine (the flu shot). Young children who get the flu shot along with pneumococcal vaccine (PCV13), and/or DTaP vaccine at the same time might be slightly more likely to have a seizure caused by fever. Tell your health care provider if a child who is getting flu vaccine has ever had a seizure. People sometimes faint after medical procedures, including vaccination. Tell your provider if you feel dizzy or have vision changes or ringing in the ears. As with any medicine, there is a very remote chance of a vaccine causing a severe allergic reaction, other serious injury, or death. 5. What if there is a serious problem? An allergic reaction could occur after  the vaccinated person leaves the clinic. If you see signs of a severe allergic reaction (hives, swelling of the face and throat, difficulty breathing, a fast heartbeat, dizziness, or weakness), call 9-1-1 and get the person to the nearest  hospital. For other signs that concern you, call your health care provider. Adverse reactions should be reported to the Vaccine Adverse Event Reporting System (VAERS). Your health care provider will usually file this report, or you can do it yourself. Visit the VAERS website at www.vaers.LAgents.no or call (913)718-9853.VAERS is only for reporting reactions, and VAERS staff do not give medical advice. 6. The National Vaccine Injury Compensation Program The [employer] Vaccine Injury Compensation Program (VICP) is a federal program that was created to compensate people who may have been injured by certain vaccines. Visit the VICP website at SpiritualWord.at or call 339-614-8557 to learn about the program and about filing a claim. There is a time limit to file a claim for compensation. 7. How can I learn more?  Ask your healthcare provider.  Call your local or state health department.  Contact the Centers for Disease Control and Prevention (CDC): ? Call 939-074-9495 (1-800-CDC-INFO) or ? Visit CDC's BiotechRoom.com.cy Vaccine Information Statement (Interim) Inactivated Influenza Vaccine (10/07/2017) This information is not intended to replace advice given to you by your health care provider. Make sure you discuss any questions you have with your health care provider. Document Revised: 05/31/2018 Document Reviewed: 10/11/2017 Elsevier Patient Education  2020 [employer].

## 2019-12-25 NOTE — Progress Notes (Signed)
  Subjective  Fetal Movement? yes Contractions? no Leaking Fluid? no Vaginal Bleeding? no  Objective  BP 122/80   LMP 05/01/2019  General: NAD Pumonary: no increased work of breathing Abdomen: gravid, non-tender Extremities: no edema Psychiatric: mood appropriate, affect full  Assessment  24 y.o. G4W1027 at [redacted]w[redacted]d by  02/05/2020, by Last Menstrual Period presenting for routine prenatal visit  Plan   Problem List Items Addressed This Visit      Other   Supervision of other normal pregnancy, antepartum    Other Visit Diagnoses    [redacted] weeks gestation of pregnancy    -  Primary      Overview Addendum 12/25/2019  1:39 PM by Nadara Mustard, MD    Clinic [facility] Prenatal Labs  Dating LMP = 10 WK [place] Blood type: O/Positive/-- (05/17 1130)   Genetic Screen    NIPS:nml XY Antibody:Negative (05/17 1130)  Anatomic [place]  normal anatomy- female Rubella: 2.43 (05/17 1130) Varicella: Immune  GTT Third trimester: WNL RPR: Non Reactive (05/17 1130)   Rhogam O+ HBsAg: Negative (05/17 1130)   TDaP vaccine 11/1     Flu Shot:11/1 HIV: Non Reactive (05/17 1130)   Baby Food Breast GBS: nv  Contraception IUD Pap: 06/26/19 NIL  CBB  No   CS/VBAC N/A   Support Person Husband Casimiro Needle          PNV, [facility] Discussed breast feeding and contraception, desires IUD TDaP and Flu shots today  Annamarie Major, MD, [facility], [facility] 12/25/2019  1:41 PM

## 2019-12-25 NOTE — Addendum Note (Signed)
Addended by: Cornelius Moras D on: 12/25/2019 01:50 PM   Modules accepted: Orders

## 2020-01-09 ENCOUNTER — Other Ambulatory Visit: Payer: Self-pay

## 2020-01-09 ENCOUNTER — Ambulatory Visit (INDEPENDENT_AMBULATORY_CARE_PROVIDER_SITE_OTHER): Payer: Medicaid Other | Admitting: Obstetrics

## 2020-01-09 ENCOUNTER — Encounter: Payer: Self-pay | Admitting: Obstetrics

## 2020-01-09 VITALS — BP 110/60 | Wt 183.0 lb

## 2020-01-09 DIAGNOSIS — Z3A36 36 weeks gestation of pregnancy: Secondary | ICD-10-CM

## 2020-01-09 DIAGNOSIS — Z3483 Encounter for supervision of other normal pregnancy, third trimester: Secondary | ICD-10-CM

## 2020-01-09 LAB — POCT URINALYSIS DIPSTICK OB
Glucose, UA: NEGATIVE
POC,PROTEIN,UA: NEGATIVE

## 2020-01-09 NOTE — Progress Notes (Signed)
  Routine Prenatal Care Visit  Subjective  Taylor Lucas is a 24 y.o. [id number] at [redacted]w[redacted]d being seen today for ongoing prenatal care.  She is currently monitored for the following issues for this low-risk pregnancy and has Vomiting affecting pregnancy, antepartum and Supervision of other normal pregnancy, antepartum on their problem list.  ----------------------------------------------------------------------------------- Patient reports no complaints.   Contractions: Not present.  .  Movement: Present. Leaking Fluid denies.  ----------------------------------------------------------------------------------- The following portions of the patient's history were reviewed and updated as appropriate: allergies, current medications, past family history, past medical history, past social history, past surgical history and problem list. Problem list updated.  Objective  Blood pressure 110/60, weight 183 lb (83 kg), last menstrual period 05/01/2019, not currently breastfeeding. Pregravid weight 167 lb (75.8 kg) Total Weight Gain 16 lb (7.258 kg) Urinalysis: Urine Protein Negative  Urine Glucose Negative  Fetal Status: Fetal Heart Rate (bpm): 135 Fundal Height: 35 cm Movement: Present  Presentation: Vertex  General:  Alert, oriented and cooperative. Patient is in no acute distress.  Skin: Skin is warm and dry. No rash noted.   Cardiovascular: Normal heart rate noted  Respiratory: Normal respiratory effort, no problems with respiration noted  Abdomen: Soft, gravid, appropriate for gestational age. Pain/Pressure: Absent     Pelvic:  Cervical exam performed        Extremities: Normal range of motion.  Edema: None  Mental Status: Normal mood and affect. Normal behavior. Normal judgment and thought content.   Assessment   24 y.o. [id number] at [redacted]w[redacted]d by  02/05/2020, by Last Menstrual Period presenting for routine prenatal visit  Plan   pregnancy4  Problems (from 05/01/19 to present)    Problem Noted  Resolved   Supervision of other normal pregnancy, antepartum 06/26/2019 by Nadara Mustard, MD No   Overview Addendum 12/25/2019  1:39 PM by Nadara Mustard, MD    Clinic [facility] Prenatal Labs  Dating LMP = 10 WK [place] Blood type: O/Positive/-- (05/17 1130)   Genetic Screen    NIPS:nml XY Antibody:Negative (05/17 1130)  Anatomic [place]  normal anatomy- female Rubella: 2.43 (05/17 1130) Varicella: @VZVIGG @  GTT Third trimester:  RPR: Non Reactive (05/17 1130)   Rhogam O+ HBsAg: Negative (05/17 1130)   TDaP vaccine 11/1     Flu Shot:11/1 HIV: Non Reactive (05/17 1130)   Baby Food Breast GBS:   Contraception IUD Pap: 06/26/19 NIL  CBB  No   CS/VBAC N/A   Support Person Husband 08/26/19           Previous Version       Term labor symptoms and general obstetric precautions including but not limited to vaginal bleeding, contractions, leaking of fluid and fetal movement were reviewed in detail with the patient. Please refer to After Visit Summary for other counseling recommendations.   Patient desires 37 week IOL for social circumstances. Discussed risks and benefits with patient. Will plan to revisit at future ROB to schedule. GBS cx and GC/CT gathered. Return in about 3 weeks (around 01/30/2020) for ROB.  14/08/2019, CNM  01/09/2020 2:33 PM

## 2020-01-09 NOTE — Addendum Note (Signed)
Addended by: Mirna Mires on: 01/09/2020 02:51 PM   Modules accepted: Orders

## 2020-01-09 NOTE — Progress Notes (Signed)
Noi concerns,rj

## 2020-01-09 NOTE — Patient Instructions (Signed)

## 2020-01-10 ENCOUNTER — Other Ambulatory Visit: Payer: Self-pay | Admitting: Obstetrics

## 2020-01-10 ENCOUNTER — Other Ambulatory Visit (HOSPITAL_COMMUNITY)
Admission: RE | Admit: 2020-01-10 | Discharge: 2020-01-10 | Disposition: A | Discharge status: Home / Self Care | Payer: BLUE CROSS/BLUE SHIELD | Source: Ambulatory Visit | Attending: Obstetrics | Admitting: Obstetrics

## 2020-01-10 ENCOUNTER — Telehealth: Payer: Self-pay

## 2020-01-10 DIAGNOSIS — Z3483 Encounter for supervision of other normal pregnancy, third trimester: Secondary | ICD-10-CM | POA: Insufficient documentation

## 2020-01-10 DIAGNOSIS — Z3A36 36 weeks gestation of pregnancy: Secondary | ICD-10-CM

## 2020-01-10 NOTE — Telephone Encounter (Signed)
Janine c [facility] Cytology Dept calling; received swab on pt but it was ordered wrong.  [phone]  Janine states GC/Chlm needs to be ordered without the RNA.  Please change order.

## 2020-01-10 NOTE — Addendum Note (Signed)
Addended by: Mirna Mires on: 01/10/2020 02:58 PM   Modules accepted: Orders

## 2020-01-10 NOTE — Addendum Note (Signed)
Addended by: Mirna Mires on: 01/10/2020 02:52 PM   Modules accepted: Orders

## 2020-01-12 LAB — CERVICOVAGINAL ANCILLARY ONLY
Chlamydia: NEGATIVE
Comment: NEGATIVE
Comment: NORMAL
Neisseria Gonorrhea: NEGATIVE

## 2020-01-13 LAB — CULTURE, BETA STREP (GROUP B ONLY): Strep Gp B Culture: NEGATIVE

## 2020-01-15 ENCOUNTER — Ambulatory Visit (INDEPENDENT_AMBULATORY_CARE_PROVIDER_SITE_OTHER): Payer: Medicaid Other | Admitting: Obstetrics

## 2020-01-15 ENCOUNTER — Observation Stay
Admission: EM | Admit: 2020-01-15 | Discharge: 2020-01-15 | Disposition: A | Discharge status: Home / Self Care | Payer: BLUE CROSS/BLUE SHIELD | Attending: Obstetrics and Gynecology | Admitting: Obstetrics and Gynecology

## 2020-01-15 ENCOUNTER — Encounter: Payer: Self-pay | Admitting: Obstetrics and Gynecology

## 2020-01-15 ENCOUNTER — Other Ambulatory Visit: Payer: Self-pay

## 2020-01-15 VITALS — BP 120/70 | Wt 184.0 lb

## 2020-01-15 DIAGNOSIS — Z3A37 37 weeks gestation of pregnancy: Secondary | ICD-10-CM | POA: Insufficient documentation

## 2020-01-15 DIAGNOSIS — O4193X0 Disorder of amniotic fluid and membranes, unspecified, third trimester, not applicable or unspecified: Principal | ICD-10-CM | POA: Insufficient documentation

## 2020-01-15 DIAGNOSIS — Z3480 Encounter for supervision of other normal pregnancy, unspecified trimester: Secondary | ICD-10-CM

## 2020-01-15 DIAGNOSIS — Z3483 Encounter for supervision of other normal pregnancy, third trimester: Secondary | ICD-10-CM

## 2020-01-15 DIAGNOSIS — Z3490 Encounter for supervision of normal pregnancy, unspecified, unspecified trimester: Secondary | ICD-10-CM

## 2020-01-15 LAB — POCT URINALYSIS DIPSTICK OB: Glucose, UA: NEGATIVE

## 2020-01-15 LAB — RUPTURE OF MEMBRANE (ROM)PLUS: Rom Plus: NEGATIVE

## 2020-01-15 NOTE — OB Triage Note (Signed)
Pt told of negative ROM+ lab results. No ctx noted during visit. FH tracing category I strip during monitoring. Educated about further evaluation of LOF, instructed to come back or call with increased LOF, vaginal discharge, vaginal bleeding, or ctx.

## 2020-01-15 NOTE — OB Triage Note (Signed)
Pt G4P1 [redacted]w[redacted]d [facility] to [facility] w/ c/o possible leaking of fluid starting at 2000 this evening 11/22 denies vag bleeding or ctx. Denies pain, Monitors applied and assessing.

## 2020-01-15 NOTE — Progress Notes (Signed)
No concerns.rj 

## 2020-01-15 NOTE — Progress Notes (Signed)
  Routine Prenatal Care Visit  Subjective  Taylor Lucas is a 24 y.o. [id number] at [redacted]w[redacted]d being seen today for ongoing prenatal care.  She is currently monitored for the following issues for this low-risk pregnancy and has Vomiting affecting pregnancy, antepartum and Supervision of other normal pregnancy, antepartum on their problem list.  ----------------------------------------------------------------------------------- Patient reports no complaints.  She is interested in an IOL once she is 39 weeks.  .  .   Pincus Large Fluid denies.  ----------------------------------------------------------------------------------- The following portions of the patient's history were reviewed and updated as appropriate: allergies, current medications, past family history, past medical history, past social history, past surgical history and problem list. Problem list updated.  Objective  Blood pressure 120/70, weight 184 lb (83.5 kg), last menstrual period 05/01/2019, not currently breastfeeding. Pregravid weight 167 lb (75.8 kg) Total Weight Gain 17 lb (7.711 kg) Urinalysis: Urine Protein Trace  Urine Glucose Negative  Fetal Status:           General:  Alert, oriented and cooperative. Patient is in no acute distress.  Skin: Skin is warm and dry. No rash noted.   Cardiovascular: Normal heart rate noted  Respiratory: Normal respiratory effort, no problems with respiration noted  Abdomen: Soft, gravid, appropriate for gestational age.       Pelvic:  Cervical exam deferred        Extremities: Normal range of motion.     Mental Status: Normal mood and affect. Normal behavior. Normal judgment and thought content.   Assessment   24 y.o. [id number] at [redacted]w[redacted]d by  02/05/2020, by Last Menstrual Period presenting for routine prenatal visit  Plan   pregnancy4  Problems (from 05/01/19 to present)    Problem Noted Resolved   Supervision of other normal pregnancy, antepartum 06/26/2019 by Nadara Mustard, MD No    Overview Addendum 01/15/2020 10:42 AM by Mirna Mires, CNM    Clinic [facility] Prenatal Labs  Dating LMP = 10 WK [place] Blood type: O/Positive/-- (05/17 1130)   Genetic Screen    NIPS:nml XY Antibody:Negative (05/17 1130)  Anatomic [place]  normal anatomy- female Rubella: 2.43 (05/17 1130) Varicella: @VZVIGG @  GTT Third trimester:  RPR: Non Reactive (05/17 1130)   Rhogam O+ HBsAg: Negative (05/17 1130)   TDaP vaccine 11/1     Flu Shot:11/1 HIV: Non Reactive (05/17 1130)   Baby Food Breast GBS: 11/22. negative  Contraception IUD Pap: 06/26/19 NIL  CBB  No   CS/VBAC N/A   Support Person Husband 08/26/19           Previous Version       Term labor symptoms and general obstetric precautions including but not limited to vaginal bleeding, contractions, leaking of fluid and fetal movement were reviewed in detail with the patient. Please refer to After Visit Summary for other counseling recommendations.   Return in about 1 week (around 01/22/2020) for return OB. Plan to schedule her IOL at that time.  01/24/2020, CNM  01/15/2020 10:47 AM

## 2020-01-16 NOTE — Discharge Summary (Signed)
Pt with LOF   checked by RN  Neg ROM + test  Reassuring fetal monitoring   D/C with precautions

## 2020-01-22 ENCOUNTER — Encounter: Payer: BC Managed Care – PPO | Admitting: Obstetrics

## 2020-01-24 ENCOUNTER — Other Ambulatory Visit: Payer: Self-pay

## 2020-01-24 ENCOUNTER — Ambulatory Visit (INDEPENDENT_AMBULATORY_CARE_PROVIDER_SITE_OTHER): Payer: BC Managed Care – PPO | Admitting: Obstetrics

## 2020-01-24 VITALS — BP 122/78 | Wt 187.0 lb

## 2020-01-24 DIAGNOSIS — Z3480 Encounter for supervision of other normal pregnancy, unspecified trimester: Secondary | ICD-10-CM

## 2020-01-24 DIAGNOSIS — Z3A38 38 weeks gestation of pregnancy: Secondary | ICD-10-CM

## 2020-01-24 DIAGNOSIS — Z419 Encounter for procedure for purposes other than remedying health state, unspecified: Secondary | ICD-10-CM | POA: Diagnosis not present

## 2020-01-24 NOTE — Progress Notes (Signed)
No vb. No lof.  

## 2020-01-24 NOTE — Progress Notes (Signed)
  Routine Prenatal Care Visit  Subjective  Taylor Lucas is a 24 y.o. [id number] at [redacted]w[redacted]d being seen today for ongoing prenatal care.  She is currently monitored for the following issues for this low-risk pregnancy and has Vomiting affecting pregnancy, antepartum; Supervision of other normal pregnancy, antepartum; and Pregnancy on their problem list.  ----------------------------------------------------------------------------------- Patient reports no complaints. She is requesting an IOL at 39 weeks.  Contractions: Not present. Vag. Bleeding: None.  Movement: Present. Leaking Fluid denies.  ----------------------------------------------------------------------------------- The following portions of the patient's history were reviewed and updated as appropriate: allergies, current medications, past family history, past medical history, past social history, past surgical history and problem list. Problem list updated.  Objective  Blood pressure 122/78, weight 187 lb (84.8 kg), last menstrual period 05/01/2019, not currently breastfeeding. Pregravid weight 167 lb (75.8 kg) Total Weight Gain 20 lb (9.072 kg) Urinalysis: Urine Protein    Urine Glucose    Fetal Status:     Movement: Present     General:  Alert, oriented and cooperative. Patient is in no acute distress.  Skin: Skin is warm and dry. No rash noted.   Cardiovascular: Normal heart rate noted  Respiratory: Normal respiratory effort, no problems with respiration noted  Abdomen: Soft, gravid, appropriate for gestational age. Pain/Pressure: Present     Pelvic:  Cervical exam performed      1.5 cms/50%/-3. Baby is still pretty high  Extremities: Normal range of motion.     Mental Status: Normal mood and affect. Normal behavior. Normal judgment and thought content.   Assessment   24 y.o. W7P7106 at [redacted]w[redacted]d by  02/05/2020, by Last Menstrual Period presenting for routine prenatal visit  Plan   pregnancy4  Problems (from 05/01/19 to  present)    Problem Noted Resolved   Supervision of other normal pregnancy, antepartum 06/26/2019 by Nadara Mustard, MD No   Overview Addendum 01/15/2020 10:42 AM by Mirna Mires, CNM    Clinic [facility] Prenatal Labs  Dating LMP = 10 WK [place] Blood type: O/Positive/-- (05/17 1130)   Genetic Screen    NIPS:nml XY Antibody:Negative (05/17 1130)  Anatomic [place]  normal anatomy- female Rubella: 2.43 (05/17 1130) Varicella: @VZVIGG @  GTT Third trimester:  RPR: Non Reactive (05/17 1130)   Rhogam O+ HBsAg: Negative (05/17 1130)   TDaP vaccine 11/1     Flu Shot:11/1 HIV: Non Reactive (05/17 1130)   Baby Food Breast GBS: 11/22. negative  Contraception IUD Pap: 06/26/19 NIL  CBB  No   CS/VBAC N/A   Support Person Husband 08/26/19           Previous Version       Term labor symptoms and general obstetric precautions including but not limited to vaginal bleeding, contractions, leaking of fluid and fetal movement were reviewed in detail with the patient. Please refer to After Visit Summary for other counseling recommendations.  She is set up for an IOL on Monday December 6th at 0500 with 05-27-1989. coming on as the provider.  Return in about 1 week (around 01/31/2020) for return OB.  14/09/2019, CNM  01/24/2020 10:24 AM

## 2020-01-24 NOTE — H&P (Signed)
Taylor Lucas is a 24 y.o. female presenting for elective induction of labor at term.. OB History    Gravida  4   Para  1   Term  1   Preterm      AB  2   Living  1     SAB  1   TAB  1   Ectopic      Multiple  0   Live Births  1          Past Medical History:  Diagnosis Date  . Dysmenorrhea   . Family history of breast cancer    mom age 88; BRCA neg; pt qualifies for update testing when older  . Rib fracture 10/2008   sports injury   Past Surgical History:  Procedure Laterality Date  . DILATION AND CURETTAGE OF UTERUS    . nexplanon     2015   Family History: family history includes Breast cancer (age of onset: 25) in her mother; Diabetes in her paternal grandfather; Hypertension in her father; Kidney Stones in her father; Renal cancer in her paternal grandmother; Stroke in her maternal grandfather. Social History:  reports that she has never smoked. She has never used smokeless tobacco. She reports current alcohol use. She reports that she does not use drugs.     Maternal Diabetes: No Genetic Screening: Normal Maternal Ultrasounds/Referrals: Normal Fetal Ultrasounds or other Referrals:  None Maternal Substance Abuse:  No Significant Maternal Medications:  None Significant Maternal Lab Results:  Group B Strep negative Other Comments:  None  Review of Systems  Constitutional: Negative.   HENT: Negative.   Respiratory: Negative.   Cardiovascular: Negative.   Gastrointestinal:       Gravid abdomen  Endocrine: Negative.   Genitourinary: Negative.   Musculoskeletal: Negative.   Allergic/Immunologic: Negative.   Neurological: Negative.   Hematological: Negative.   Psychiatric/Behavioral: Negative.    History Dilation: 1.5 Effacement (%): 50 Station: -3 Blood pressure 122/78, weight 187 lb (84.8 kg), last menstrual period 05/01/2019, not currently breastfeeding. Maternal Exam:  Uterine Assessment: Contraction frequency is rare.   Abdomen:  Patient reports no abdominal tenderness. Fundal height is 37 cms.   Fetal presentation: vertex  Introitus: Normal vulva. Normal vagina.  Ferning test: not done.  Nitrazine test: not done.  Pelvis: adequate for delivery.   Cervix: Cervix evaluated by digital exam.     Physical Exam Constitutional:      Appearance: Normal appearance.  HENT:     Head: Normocephalic and atraumatic.     Right Ear: Tympanic membrane normal.     Left Ear: Tympanic membrane normal.     Nose: Nose normal.  Eyes:     Extraocular Movements: Extraocular movements intact.     Pupils: Pupils are equal, round, and reactive to light.  Cardiovascular:     Rate and Rhythm: Normal rate and regular rhythm.     Pulses: Normal pulses.     Heart sounds: Normal heart sounds.  Pulmonary:     Effort: Pulmonary effort is normal.     Breath sounds: Normal breath sounds.  Abdominal:     General: Bowel sounds are normal.     Palpations: Abdomen is soft.     Comments: gravid  Genitourinary:    General: Normal vulva.     Comments: VE: 1.5 cms/50%/-3, softening Musculoskeletal:        General: Normal range of motion.     Cervical back: Normal range of motion and neck supple.  Skin:    General: Skin is dry.  Neurological:     Mental Status: She is alert and oriented to person, place, and time.  Psychiatric:        Mood and Affect: Mood normal.        Behavior: Behavior normal.     Prenatal labs: ABO, Rh: O/Positive/-- (05/17 1130) Antibody: Negative (05/17 1130) Rubella: 2.43 (05/17 1130) RPR: Non Reactive (10/05 1114)  HBsAg: Negative (05/17 1130)  HIV: Non Reactive (10/05 1114)  GBS: Negative/-- (11/16 1630)   Assessment/Plan: IUP 39 weeks for elective induction EFM, Routine labs Will start with cytotec placement, then advance to pitocin infusion per protocol. IV access. She plans on epidural anesthesia.   Taylor Lucas 01/24/2020, 10:35 AM

## 2020-01-29 ENCOUNTER — Inpatient Hospital Stay: Payer: BLUE CROSS/BLUE SHIELD | Admitting: Anesthesiology

## 2020-01-29 ENCOUNTER — Other Ambulatory Visit: Payer: Self-pay

## 2020-01-29 ENCOUNTER — Inpatient Hospital Stay
Admission: EM | Admit: 2020-01-29 | Discharge: 2020-01-31 | DRG: 806 | Disposition: A | Discharge status: Home / Self Care | Payer: BLUE CROSS/BLUE SHIELD | Attending: Obstetrics and Gynecology | Admitting: Obstetrics and Gynecology

## 2020-01-29 ENCOUNTER — Encounter: Payer: Self-pay | Admitting: Obstetrics

## 2020-01-29 DIAGNOSIS — D62 Acute posthemorrhagic anemia: Secondary | ICD-10-CM | POA: Diagnosis not present

## 2020-01-29 DIAGNOSIS — O26893 Other specified pregnancy related conditions, third trimester: Secondary | ICD-10-CM | POA: Diagnosis not present

## 2020-01-29 DIAGNOSIS — Z3480 Encounter for supervision of other normal pregnancy, unspecified trimester: Secondary | ICD-10-CM

## 2020-01-29 DIAGNOSIS — Z20822 Contact with and (suspected) exposure to covid-19: Secondary | ICD-10-CM | POA: Diagnosis not present

## 2020-01-29 DIAGNOSIS — O9081 Anemia of the puerperium: Secondary | ICD-10-CM | POA: Diagnosis not present

## 2020-01-29 DIAGNOSIS — Z3A39 39 weeks gestation of pregnancy: Secondary | ICD-10-CM | POA: Diagnosis not present

## 2020-01-29 LAB — RESP PANEL BY RT-PCR (FLU A&B, COVID) ARPGX2
Influenza A by PCR: NEGATIVE
Influenza B by PCR: NEGATIVE
SARS Coronavirus 2 by RT PCR: NEGATIVE

## 2020-01-29 LAB — TYPE AND SCREEN
ABO/RH(D): O POS
Antibody Screen: NEGATIVE

## 2020-01-29 LAB — CBC
HCT: 35.3 % — ABNORMAL LOW (ref 36.0–46.0)
Hemoglobin: 12 g/dL (ref 12.0–15.0)
MCH: 31.4 pg (ref 26.0–34.0)
MCHC: 34 g/dL (ref 30.0–36.0)
MCV: 92.4 fL (ref 80.0–100.0)
Platelets: 171 10*3/uL (ref 150–400)
RBC: 3.82 MIL/uL — ABNORMAL LOW (ref 3.87–5.11)
RDW: 13.2 % (ref 11.5–15.5)
WBC: 10.7 10*3/uL — ABNORMAL HIGH (ref 4.0–10.5)
nRBC: 0 % (ref 0.0–0.2)

## 2020-01-29 LAB — RPR: RPR Ser Ql: NONREACTIVE

## 2020-01-29 MED ORDER — LIDOCAINE-EPINEPHRINE (PF) 1.5 %-1:200000 IJ SOLN
INTRAMUSCULAR | Status: DC | PRN
  Administered 2020-01-29: 3 mL via EPIDURAL

## 2020-01-29 MED ORDER — LIDOCAINE HCL (PF) 1 % IJ SOLN
INTRAMUSCULAR | Status: DC | PRN
  Administered 2020-01-29: 3 mL via SUBCUTANEOUS
  Administered 2020-01-29: 2 mL via SUBCUTANEOUS

## 2020-01-29 MED ORDER — ONDANSETRON HCL 4 MG/2ML IJ SOLN
4.0000 mg | Freq: Four times a day (QID) | INTRAMUSCULAR | Status: DC | PRN
  Administered 2020-01-29: 4 mg via INTRAVENOUS
  Filled 2020-01-29: qty 2

## 2020-01-29 MED ORDER — OXYTOCIN BOLUS FROM INFUSION
333.0000 mL | Freq: Once | INTRAVENOUS | Status: AC
  Administered 2020-01-29: 333 mL via INTRAVENOUS

## 2020-01-29 MED ORDER — PHENYLEPHRINE 40 MCG/ML (10ML) SYRINGE FOR IV PUSH (FOR BLOOD PRESSURE SUPPORT)
80.0000 ug | PREFILLED_SYRINGE | INTRAVENOUS | Status: DC | PRN
  Filled 2020-01-29: qty 10

## 2020-01-29 MED ORDER — MISOPROSTOL 200 MCG PO TABS
ORAL_TABLET | ORAL | Status: AC
  Filled 2020-01-29: qty 4

## 2020-01-29 MED ORDER — EPHEDRINE 5 MG/ML INJ
10.0000 mg | INTRAVENOUS | Status: DC | PRN
  Filled 2020-01-29: qty 2

## 2020-01-29 MED ORDER — OXYCODONE-ACETAMINOPHEN 5-325 MG PO TABS: 2.0000 | ORAL_TABLET | ORAL | Status: DC | PRN

## 2020-01-29 MED ORDER — FENTANYL 2.5 MCG/ML W/ROPIVACAINE 0.15% IN NS 100 ML EPIDURAL (ARMC)
EPIDURAL | Status: AC
  Filled 2020-01-29: qty 100

## 2020-01-29 MED ORDER — LACTATED RINGERS IV SOLN: 500.0000 mL | INTRAVENOUS | Status: DC | PRN

## 2020-01-29 MED ORDER — FENTANYL 2.5 MCG/ML W/ROPIVACAINE 0.15% IN NS 100 ML EPIDURAL (ARMC)
12.0000 mL/h | EPIDURAL | Status: DC
  Administered 2020-01-29: 12 mL/h via EPIDURAL

## 2020-01-29 MED ORDER — LIDOCAINE HCL (PF) 1 % IJ SOLN
30.0000 mL | INTRAMUSCULAR | Status: DC | PRN
  Filled 2020-01-29: qty 30

## 2020-01-29 MED ORDER — OXYCODONE-ACETAMINOPHEN 5-325 MG PO TABS: 1.0000 | ORAL_TABLET | ORAL | Status: DC | PRN

## 2020-01-29 MED ORDER — BUPIVACAINE HCL (PF) 0.25 % IJ SOLN
INTRAMUSCULAR | Status: DC | PRN
  Administered 2020-01-29: 1 mL via EPIDURAL
  Administered 2020-01-29: 3 mL via EPIDURAL

## 2020-01-29 MED ORDER — TERBUTALINE SULFATE 1 MG/ML IJ SOLN: 0.2500 mg | Freq: Once | INTRAMUSCULAR | Status: DC | PRN

## 2020-01-29 MED ORDER — LACTATED RINGERS IV SOLN: INTRAVENOUS | Status: DC

## 2020-01-29 MED ORDER — MISOPROSTOL 25 MCG QUARTER TABLET
25.0000 ug | ORAL_TABLET | ORAL | Status: DC | PRN
  Administered 2020-01-29 (×2): 25 ug via VAGINAL
  Filled 2020-01-29 (×3): qty 1

## 2020-01-29 MED ORDER — LACTATED RINGERS IV SOLN
500.0000 mL | Freq: Once | INTRAVENOUS | Status: AC
  Administered 2020-01-29: 500 mL via INTRAVENOUS

## 2020-01-29 MED ORDER — OXYTOCIN 10 UNIT/ML IJ SOLN
INTRAMUSCULAR | Status: AC
  Filled 2020-01-29: qty 2

## 2020-01-29 MED ORDER — DIPHENHYDRAMINE HCL 50 MG/ML IJ SOLN: 12.5000 mg | INTRAMUSCULAR | Status: DC | PRN

## 2020-01-29 MED ORDER — SOD CITRATE-CITRIC ACID 500-334 MG/5ML PO SOLN: 30.0000 mL | ORAL | Status: DC | PRN

## 2020-01-29 MED ORDER — ACETAMINOPHEN 325 MG PO TABS: 650.0000 mg | ORAL_TABLET | ORAL | Status: DC | PRN

## 2020-01-29 MED ORDER — OXYTOCIN-SODIUM CHLORIDE 30-0.9 UT/500ML-% IV SOLN
1.0000 m[IU]/min | INTRAVENOUS | Status: DC
  Administered 2020-01-29: 2 m[IU]/min via INTRAVENOUS
  Filled 2020-01-29: qty 1000

## 2020-01-29 MED ORDER — AMMONIA AROMATIC IN INHA
RESPIRATORY_TRACT | Status: AC
  Filled 2020-01-29: qty 10

## 2020-01-29 NOTE — Anesthesia Procedure Notes (Signed)
Epidural Patient location during procedure: OB Start time: 01/29/2020 3:07 PM End time: 01/29/2020 3:10 PM  Staffing Anesthesiologist: Lenard Simmer, MD Resident/CRNA: Karoline Caldwell, CRNA Performed: resident/CRNA   Preanesthetic Checklist Completed: patient identified, IV checked, site marked, risks and benefits discussed, surgical consent, monitors and equipment checked, pre-op evaluation and timeout performed  Epidural Patient position: sitting Prep: ChloraPrep Patient monitoring: heart rate, continuous pulse ox and blood pressure Approach: midline Location: L3-L4 Injection technique: LOR saline  Needle:  Needle type: Tuohy  Needle gauge: 17 G Needle length: 9 cm and 9 Needle insertion depth: 6 cm Catheter type: closed end flexible Catheter size: 19 Gauge Catheter at skin depth: 11 cm Test dose: negative and 1.5% lidocaine with Epi 1:200 K  Assessment Events: blood not aspirated, injection not painful, no injection resistance, no paresthesia and negative IV test  Additional Notes 1 attempt Pt. Evaluated and documentation done after procedure finished. Patient identified. Risks/Benefits/Options discussed with patient including but not limited to bleeding, infection, nerve damage, paralysis, failed block, incomplete pain control, headache, blood pressure changes, nausea, vomiting, reactions to medication both or allergic, itching and postpartum back pain. Confirmed with bedside nurse the patient's most recent platelet count. Confirmed with patient that they are not currently taking any anticoagulation, have any bleeding history or any family history of bleeding disorders. Patient expressed understanding and wished to proceed. All questions were answered. Sterile technique was used throughout the entire procedure. Please see nursing notes for vital signs. Test dose was given through epidural catheter and negative prior to continuing to dose epidural or start infusion. Warning signs  of high block given to the patient including shortness of breath, tingling/numbness in hands, complete motor block, or any concerning symptoms with instructions to call for help. Patient was given instructions on fall risk and not to get out of bed. All questions and concerns addressed with instructions to call with any issues or inadequate analgesia.   Patient tolerated the insertion well without immediate complications.Reason for block:procedure for pain

## 2020-01-29 NOTE — Progress Notes (Signed)
Labor Progress Note  Taylor Lucas is a 24 y.o. [id number] at [redacted]w[redacted]d by LMP and 10wk [place] admitted for induction of labor due to Elective at term.  Subjective:  Patient resting comfortably in bed. Reports "feeling contractions." Rates current discomfort 4/10.   Objective: BP 102/66   Pulse 63   Temp 98.6 F (37 C) (Oral)   Resp 16   Ht 5' (1.524 m)   Wt 83 kg   LMP 05/01/2019   BMI 35.74 kg/m    Fetal Assessment: FHT:  FHR: 125 bpm, variability: moderate,  accelerations:  Present,  decelerations:  Absent Category/reactivity:  Category I UC:   irregular, every 1.5-5 minutes - palpate mild  SVE:   3/60/-2  Membrane status: intact Amniotic color: clear  Labs: Lab Results  Component Value Date   WBC 10.7 (H) 01/29/2020   HGB 12.0 01/29/2020   HCT 35.3 (L) 01/29/2020   MCV 92.4 01/29/2020   PLT 171 01/29/2020    Assessment / Plan: Elective induction of labor at term, plan to initiate pitocin    Labor: Will start pitocin Preeclampsia:  No S&S of PIH Fetal Wellbeing:  Category I Pain Control:  Currently without medication intervention - patient planning to request epidural with start of pitocin I/D:  GBS negative Anticipated MOD:  NSVD  [name], CNM 01/29/2020, 2:44 PM

## 2020-01-29 NOTE — Discharge Summary (Signed)
OB Discharge Summary     Patient Name: Taylor Lucas DOB: [birth year] MRN: [id number]  Date of admission: 01/29/2020 Delivering MD: [name], CNM  Date of Delivery: 01/29/2020  Date of discharge: 01/31/2020  Admitting diagnosis: [redacted] weeks gestation of pregnancy [Z3A.39] Intrauterine pregnancy: [redacted]w[redacted]d     Secondary diagnosis: None     Discharge diagnosis: Term Pregnancy Delivered,                          Hospital course:  Induction of Labor With Vaginal Delivery   24 y.o. yo [id number] at [redacted]w[redacted]d was admitted to the hospital 01/29/2020 for induction of labor.  Indication for induction: Favorable cervix at term.  Patient had an uncomplicated labor course as follows: Membrane Rupture Time/Date: 7:22 PM ,01/29/2020   Delivery Method:Vaginal, Spontaneous  Episiotomy: None  Lacerations:  2nd degree  Details of delivery can be found in separate delivery note.  Patient had a routine postpartum course. Patient is discharged home 01/31/20.  Newborn Data: Birth date:01/29/2020  Birth time:10:40 PM  Gender:Female  Living status:Living  Apgars:8 ,9  Weight:                                                                  Post partum procedures:none  Complications: None  Physical exam on 01/31/2020: Vitals:   01/30/20 1136 01/30/20 1533 01/30/20 2303 01/31/20 0752  BP: 120/72 113/63 109/71 109/65  Pulse: 78 68 72 63  Resp: 18 20 20 20   Temp: 97.9 F (36.6 C) 97.7 F (36.5 C) 98.2 F (36.8 C) 97.9 F (36.6 C)  TempSrc: Oral Oral Oral Oral  SpO2:   98% 98%  Weight:      Height:       General: alert, cooperative and no distress Lochia: appropriate Uterine Fundus: firm Incision: Healing well with no significant drainage DVT Evaluation: No evidence of DVT seen on physical exam. Negative Homan's sign. No cords or calf tenderness.  Labs: Lab Results  Component Value Date   WBC 16.0 (H) 01/30/2020   HGB 11.4 (L) 01/30/2020   HCT 33.5 (L) 01/30/2020   MCV 91.5 01/30/2020   PLT 159  01/30/2020   No flowsheet data found.  Discharge instruction: per After Visit Summary.  Medications:  Allergies as of 01/31/2020   No Known Allergies     Medication List    TAKE these medications   doxylamine (Sleep) 25 MG tablet Commonly known as: UNISOM Take 25 mg by mouth at bedtime as needed.   ibuprofen 600 MG tablet Commonly known as: ADVIL Take 1 tablet (600 mg total) by mouth every 6 (six) hours.   multivitamin-prenatal 27-0.8 MG Tabs tablet Take 1 tablet by mouth daily at 12 noon.       Diet: routine diet  Activity: Advance as tolerated. Pelvic rest for 6 weeks.   Outpatient follow up:  Follow-up Information    14/09/2019, CNM. Schedule an appointment as soon as possible for a visit in 6 week(s).   Specialty: Obstetrics and Gynecology Why: For postpartum visit and IUD placement (Mirena) Contact information: [address] [place] [phone]                 Postpartum contraception: IUD Mirena  Rhogam Given postpartum: n/a Rubella vaccine given postpartum: no Varicella vaccine given postpartum: no TDaP given antepartum or postpartum: yes, given  Newborn Data: Live born female  Birth Weight:   APGAR: 8, 9  Newborn Delivery   Birth date/time: 01/29/2020 22:40:00 Delivery type: Vaginal, Spontaneous       Baby Feeding: Breast  Disposition:home with mother  SIGNED: Mirna Mires, CNM  01/31/2020 8:26 AM

## 2020-01-29 NOTE — H&P (Signed)
History and Physical Interval Note:  01/29/2020 8:31 AM  Taylor Lucas  has presented today for elective INDUCTION OF LABOR,  with the diagnosis of Favorable cervix at term. The various methods of treatment have been discussed with the patient and family. After consideration of risks, benefits and other options for treatment, the patient has consented to  Labor induction .  The patient's history has been reviewed, patient examined, no change in status, and is stable for induction as planned.  See H&P. I have reviewed the patient's chart and labs.  Questions were answered to the patient's satisfaction.    [name], CNM [facility], [facility] 01/29/2020  8:31 AM

## 2020-01-29 NOTE — Anesthesia Preprocedure Evaluation (Signed)
Anesthesia Evaluation  Patient identified by MRN, date of birth, ID band Patient awake    Reviewed: Allergy & Precautions, H&P , NPO status , Patient's Chart, lab work & pertinent test results  Airway Mallampati: II  TM Distance: >3 FB Neck ROM: full    Dental  (+) Teeth Intact   Pulmonary    Pulmonary exam normal        Cardiovascular Normal cardiovascular exam     Neuro/Psych    GI/Hepatic   Endo/Other    Renal/GU      Musculoskeletal   Abdominal   Peds  Hematology   Anesthesia Other Findings   Reproductive/Obstetrics (+) Pregnancy                             Anesthesia Physical Anesthesia Plan  ASA: II  Anesthesia Plan: Epidural   Post-op Pain Management:    Induction:   PONV Risk Score and Plan:   Airway Management Planned:   Additional Equipment:   Intra-op Plan:   Post-operative Plan:   Informed Consent: I have reviewed the patients History and Physical, chart, labs and discussed the procedure including the risks, benefits and alternatives for the proposed anesthesia with the patient or authorized representative who has indicated his/her understanding and acceptance.     Dental Advisory Given  Plan Discussed with: Anesthesiologist and CRNA  Anesthesia Plan Comments:         Anesthesia Quick Evaluation

## 2020-01-29 NOTE — Progress Notes (Signed)
Labor Progress Note  Taylor Lucas is a 24 y.o. [id number] at [redacted]w[redacted]d by LMP and 10wk [place] admitted for induction of labor due to Elective at term.  Subjective: Patient resting comfortably with epidural in place. Denies sensation of contractions at this time.   Objective: BP (!) 113/55   Pulse 64   Temp 98.9 F (37.2 C) (Oral)   Resp 16   Ht 5' (1.524 m)   Wt 83 kg   LMP 05/01/2019   SpO2 99%   BMI 35.74 kg/m    Fetal Assessment: FHT:  FHR: 125 bpm, variability: moderate,  accelerations:  Present,  decelerations:  Present variables noted Category/reactivity:  Category II UC:   regular, every 1.5-3 minutes - palpate moderate  SVE:   5/70/-2 Membrane status: AROM Amniotic color: clear  Labs: Lab Results  Component Value Date   WBC 10.7 (H) 01/29/2020   HGB 12.0 01/29/2020   HCT 35.3 (L) 01/29/2020   MCV 92.4 01/29/2020   PLT 171 01/29/2020    Assessment / Plan: Elective induction of labor,  progressing on pitocin, s/p AROM  Labor: Progressing on Pitocin Preeclampsia:  No S&S of PIH Fetal Wellbeing:  Category II Pain Control:  Epidural I/D:  GBS negative Anticipated MOD:  NSVD  [name], CNM 01/29/2020, 7:31 PM

## 2020-01-29 NOTE — Progress Notes (Deleted)
   01/29/20 1024  Provider Notification  Provider Name/Title Jobie Quaker, CNM  Method of Notification Phone  Request Evaluate in person  Notification Reason Other (Comment) (labor eval)    I talked with Jobie Quaker, CNM to inform her of patient's arrival and complaint. FHR strip was reviewed as well as the patient's history and current presentation. Taylor Lucas is coming to see patient in person now.

## 2020-01-29 NOTE — Progress Notes (Signed)
Labor Progress Note  Taylor Lucas is a 24 y.o. S8P1031 at [redacted]w[redacted]d by LMP and 10wk [place] admitted for induction of labor due to Elective at term.  Subjective: Patient up to birthing ball. Reports "period-like cramps."   Objective: BP 124/77 (BP Location: Left Arm)   Pulse 64   Temp 98.7 F (37.1 C) (Oral)   Resp 17   Ht 5' (1.524 m)   Wt 83 kg   LMP 05/01/2019   BMI 35.74 kg/m    Fetal Assessment: FHT:  FHR: 130 bpm, variability: moderate,  accelerations:  Present,  decelerations:  Present intermittent variables - resolved with position change Category/reactivity:  Category II UC:   irregular, every 1.5-3 minutes - palpate mild  SVE:   1.5/50/-2   Membrane status: intact Amniotic color: n/a  Labs: Lab Results  Component Value Date   WBC 10.7 (H) 01/29/2020   HGB 12.0 01/29/2020   HCT 35.3 (L) 01/29/2020   MCV 92.4 01/29/2020   PLT 171 01/29/2020    Assessment / Plan: Elective induction of labor at term, continue cervical ripening with cytotec and foley bulb placement  Labor: Cytotec and foley bulb, pitcoin when indicated Preeclampsia:  No S&S of PIH Fetal Wellbeing:  Category II Pain Control:  Labor support without medications I/D:  GBS negative Anticipated MOD:  NSVD  Taylor Lucas, CNM 01/29/2020, 11:13 AM

## 2020-01-30 ENCOUNTER — Encounter: Payer: Self-pay | Admitting: Obstetrics

## 2020-01-30 LAB — CBC
HCT: 33.5 % — ABNORMAL LOW (ref 36.0–46.0)
Hemoglobin: 11.4 g/dL — ABNORMAL LOW (ref 12.0–15.0)
MCH: 31.1 pg (ref 26.0–34.0)
MCHC: 34 g/dL (ref 30.0–36.0)
MCV: 91.5 fL (ref 80.0–100.0)
Platelets: 159 10*3/uL (ref 150–400)
RBC: 3.66 MIL/uL — ABNORMAL LOW (ref 3.87–5.11)
RDW: 13 % (ref 11.5–15.5)
WBC: 16 10*3/uL — ABNORMAL HIGH (ref 4.0–10.5)
nRBC: 0 % (ref 0.0–0.2)

## 2020-01-30 MED ORDER — WITCH HAZEL-GLYCERIN EX PADS: 1.0000 "application " | MEDICATED_PAD | CUTANEOUS | Status: DC | PRN

## 2020-01-30 MED ORDER — SENNOSIDES-DOCUSATE SODIUM 8.6-50 MG PO TABS
2.0000 | ORAL_TABLET | ORAL | Status: DC
  Administered 2020-01-30: 2 via ORAL
  Filled 2020-01-30: qty 2

## 2020-01-30 MED ORDER — ONDANSETRON HCL 4 MG PO TABS: 4.0000 mg | ORAL_TABLET | ORAL | Status: DC | PRN

## 2020-01-30 MED ORDER — DIPHENHYDRAMINE HCL 25 MG PO CAPS: 25.0000 mg | ORAL_CAPSULE | Freq: Four times a day (QID) | ORAL | Status: DC | PRN

## 2020-01-30 MED ORDER — ONDANSETRON HCL 4 MG/2ML IJ SOLN: 4.0000 mg | INTRAMUSCULAR | Status: DC | PRN

## 2020-01-30 MED ORDER — IBUPROFEN 600 MG PO TABS
600.0000 mg | ORAL_TABLET | Freq: Four times a day (QID) | ORAL | Status: DC
  Administered 2020-01-30 – 2020-01-31 (×4): 600 mg via ORAL
  Filled 2020-01-30 (×4): qty 1

## 2020-01-30 MED ORDER — SENNOSIDES-DOCUSATE SODIUM 8.6-50 MG PO TABS: 2.0000 | ORAL_TABLET | ORAL | Status: DC

## 2020-01-30 MED ORDER — SIMETHICONE 80 MG PO CHEW: 80.0000 mg | CHEWABLE_TABLET | ORAL | Status: DC | PRN

## 2020-01-30 MED ORDER — DIBUCAINE (PERIANAL) 1 % EX OINT: 1.0000 "application " | TOPICAL_OINTMENT | CUTANEOUS | Status: DC | PRN

## 2020-01-30 MED ORDER — COCONUT OIL OIL: 1.0000 "application " | TOPICAL_OIL | Status: DC | PRN

## 2020-01-30 MED ORDER — ZOLPIDEM TARTRATE 5 MG PO TABS: 5.0000 mg | ORAL_TABLET | Freq: Every evening | ORAL | Status: DC | PRN

## 2020-01-30 MED ORDER — IBUPROFEN 600 MG PO TABS: 600.0000 mg | ORAL_TABLET | Freq: Four times a day (QID) | ORAL | Status: DC

## 2020-01-30 MED ORDER — BENZOCAINE-MENTHOL 20-0.5 % EX AERO: 1.0000 "application " | INHALATION_SPRAY | CUTANEOUS | Status: DC | PRN

## 2020-01-30 MED ORDER — PRENATAL MULTIVITAMIN CH
1.0000 | ORAL_TABLET | Freq: Every day | ORAL | Status: DC
  Administered 2020-01-30: 1 via ORAL
  Filled 2020-01-30: qty 1

## 2020-01-30 MED ORDER — ACETAMINOPHEN 325 MG PO TABS
650.0000 mg | ORAL_TABLET | ORAL | Status: DC | PRN
  Administered 2020-01-30 (×2): 650 mg via ORAL
  Filled 2020-01-30 (×2): qty 2

## 2020-01-30 NOTE — Progress Notes (Signed)
Obstetric Postpartum Daily Progress Note  Chief Complaint: Postpartum care after vaginal delivery  Subjective:  24 y.o. [id number] postpartum day #1 status post vaginal delivery.  She is ambulating, is tolerating po, is voiding spontaneously.  Her pain is well controlled on PO pain medications. Her lochia is less than menses.   Medications SCHEDULED MEDICATIONS  . ibuprofen  600 mg Oral Q6H  . prenatal multivitamin  1 tablet Oral Q1200  . senna-docusate  2 tablet Oral Q24H    MEDICATION INFUSIONS    PRN MEDICATIONS  acetaminophen, benzocaine-Menthol, coconut oil, witch hazel-glycerin **AND** dibucaine, diphenhydrAMINE, ondansetron **OR** ondansetron (ZOFRAN) IV, simethicone, zolpidem    Objective:   Vitals:   01/30/20 0100 01/30/20 0150 01/30/20 0300 01/30/20 0725  BP: (!) 109/56 120/67 110/68 107/80  Pulse: 83 80 86 87  Resp:  20 20 20   Temp:  98.9 F (37.2 C) 98.2 F (36.8 C) 98.2 F (36.8 C)  TempSrc:  Oral Oral Oral  SpO2:  99% 98% 99%  Weight:      Height:        Current Vital Signs 24h Vital Sign Ranges  T 98.2 F (36.8 C) Temp  Avg: 98.7 F (37.1 C)  Min: 98.2 F (36.8 C)  Max: 99 F (37.2 C)  BP 107/80 BP  Min: 90/39  Max: 143/116  HR 87 Pulse  Avg: 76.4  Min: 61  Max: 136  RR 20 Resp  Avg: 19  Min: 16  Max: 20  SaO2 99 % Room Air SpO2  Avg: 97.8 %  Min: 95 %  Max: 100 %       24 Hour I/O Current Shift I/O  Time Ins Outs 12/06 0701 - 12/07 0700 In: 327.7 [I.V.:227.7] Out: 2395 [Urine:2025] No intake/output data recorded.  General: NAD Pulmonary: no increased work of breathing Abdomen: non-distended, non-tender, fundus firm at level of umbilicus Extremities: no edema, no erythema, no tenderness  Labs:  Recent Labs  Lab 01/29/20 0630  WBC 10.7*  HGB 12.0  HCT 35.3*  PLT 171     Assessment:   24 y.o. 22 postpartum day # 1 status post NSVD. Recovery well.  Plan:   1) Acute blood loss anemia - hemodynamically stable and asymptomatic  2) O  POS /  Information for the patient's newborn:  Mobley, Boy Jezel Devonne Doughty  O POS   / Rubella 2.43 (05/17 1130)/ Varicella Immune  3) TDAP status UTD: 12/25/19  4) breast /Contraception = IUD  5) Disposition - plan for discharge home tomorrow  13/1/21, [name] 01/30/2020 7:48 AM

## 2020-01-30 NOTE — Progress Notes (Signed)
Labor Progress Note  Taylor Lucas is a 24 y.o. V6P7948 at [redacted]w[redacted]d by LMP and 10w [place] admitted for induction of labor due to Elective at term.  Subjective: Patient resting comfortably in bed. Reports increase in "pressure" sensation in bottom.  Objective: BP (!) 111/58   Pulse 69   Temp 99 F (37.2 C) (Oral)   Resp 16   Ht 5' (1.524 m)   Wt 83 kg   LMP 05/01/2019   SpO2 96%   BMI 35.74 kg/m    Fetal Assessment: FHT:  FHR: 130 bpm, variability: moderate,  accelerations:  Present,  decelerations:  Present variables Category/reactivity:  Category II UC:   regular, every 1.5-3 minutes SVE:   7-8cm/100/-1-2 Membrane status: AROM Amniotic color: clear  Labs: Lab Results  Component Value Date   WBC 10.7 (H) 01/29/2020   HGB 12.0 01/29/2020   HCT 35.3 (L) 01/29/2020   MCV 92.4 01/29/2020   PLT 171 01/29/2020    Assessment / Plan: Elective induction of labor, progressing well pitocin - now active labor. Recurrent variables, IUPC placed and amnioinfusion initiated. Dr. Jerene Pitch reviewed plan.  Labor: IUPC placed, amnioinfusion Preeclampsia:  No S&S of PIH Fetal Wellbeing:  Category II Pain Control:  Epidural I/D:  GBS negative Anticipated MOD:  NSVD  [name], CNM 01/30/2020, 12:04 AM

## 2020-01-30 NOTE — Anesthesia Postprocedure Evaluation (Signed)
Anesthesia Post Note  Patient: Taylor Lucas  Procedure(s) Performed: AN AD HOC LABOR EPIDURAL  Patient location during evaluation: Mother Baby Anesthesia Type: Epidural Level of consciousness: oriented and awake and alert Pain management: pain level controlled Vital Signs Assessment: post-procedure vital signs reviewed and stable Respiratory status: spontaneous breathing and respiratory function stable Cardiovascular status: blood pressure returned to baseline and stable Postop Assessment: no headache, no backache, no apparent nausea or vomiting and able to ambulate Anesthetic complications: no   No complications documented.   Last Vitals:  Vitals:   01/30/20 0300 01/30/20 0725  BP: 110/68 107/80  Pulse: 86 87  Resp: 20 20  Temp: 36.8 C 36.8 C  SpO2: 98% 99%    Last Pain:  Vitals:   01/30/20 0725  TempSrc: Oral  PainSc:                  Starling Manns

## 2020-01-30 NOTE — Lactation Note (Signed)
This note was copied from a baby's chart. Lactation Consultation Note  Patient Name: Taylor Lucas GLOVF'I Date: 01/30/2020 Reason for consult: Initial assessment;Term;Infant < 6lbs (SGA)  Lactation and LC Student in to see mom and baby. Mom is G4P2, SVD and is breastfeeding. Nipple shield given to assist with feedings due to short/flat nipples. Mom independently can place shield and baby is accepting it well. Baby has been sleepy, parents attempting feeds multiple times since 8am. Baby currently in football position on L breast, positioned well with good alignment, LC praised mom for her positioning. Baby at the breast on/off nipple shield without much desire to resume feeding after 5 minutes (colostrum visible in shield at end of feeding).  LC and LC student reviewed with mom newborn stomach size, feeding patterns and behaviors, output expectations, and benefits of skin to skin. Reassured mom that baby being sleepy and spitty is normal, encouraged to spend time skin to skin and to rest as baby rests. Reviewed hand expression, encouraged hand expression prior to shield placement, between feedings, and at end of feedings. Mom voices feeling comfortable with hand expression. No other concerns voiced. Encouraged continued frequent attempts, at least 8 in first 24 hours, and to call for ongoing BF support as needed. LC name/number updated on whiteboard.  Maternal Data Formula Feeding for Exclusion: No Has patient been taught Hand Expression?: Yes Does the patient have breastfeeding experience prior to this delivery?: Yes  Feeding Feeding Type: Breast Fed  LATCH Score Latch: Repeated attempts needed to sustain latch, nipple held in mouth throughout feeding, stimulation needed to elicit sucking reflex.  Audible Swallowing: A few with stimulation (colostrum in shield)  Type of Nipple: Flat  Comfort (Breast/Nipple): Soft / non-tender  Hold (Positioning): No assistance needed to correctly  position infant at breast.  LATCH Score: 7  Interventions Interventions: Breast feeding basics reviewed;Support pillows;Hand express  Lactation Tools Discussed/Used Nipple shield size: 20   Consult Status Consult Status: Follow-up Date: 01/30/20 Follow-up type: Call as needed    Danford Bad 01/30/2020, 1:59 PM

## 2020-01-31 MED ORDER — IBUPROFEN 600 MG PO TABS
600.0000 mg | ORAL_TABLET | Freq: Four times a day (QID) | ORAL | 0 refills | Status: DC
Start: 2020-01-31 — End: 2023-11-24

## 2020-01-31 NOTE — Discharge Instructions (Signed)
Discharge Instructions:   Follow-up Appointment:  If there are any new medications, they have been ordered and will be available for pickup at the listed pharmacy on your way home from the hospital.   Call office if you have any of the following: headache, visual changes, fever >101.0 F, chills, shortness of breath, breast concerns, excessive vaginal bleeding, incision drainage or problems, leg pain or redness, depression or any other concerns. If you have vaginal discharge with an odor, let your doctor know.   It is normal to bleed for up to 6 weeks. You should not soak through more than 1 pad in 1 hour. If you have a blood clot larger than your fist with continued bleeding, call your doctor.   Activity: Do not lift > 10 lbs for 6 weeks (do not lift anything heavier than your baby). No intercourse, tampons, swimming pools, hot tubs, baths (only showers) for 6 weeks.  No driving for 1-2 weeks. Continue prenatal vitamin, especially if breastfeeding. Increase calories and fluids (water) while breastfeeding.   Your milk will come in, in the next couple of days (right now it is colostrum). You may have a slight fever when your milk comes in, but it should go away on its own.  If it does not, and rises above 101 F please call the doctor. You will also feel achy and your breasts will be firm. They will also start to leak. If you are breastfeeding, continue as you have been and you can pump/express milk for comfort.   If you have too much milk, your breasts can become engorged, which could lead to mastitis. This is an infection of the milk ducts. It can be very painful and you will need to notify your doctor to obtain a prescription for antibiotics. You can also treat it with a shower or hot/cold compress.   For concerns about your baby, please call your pediatrician.  For breastfeeding concerns, the lactation consultant can be reached at [phone].   Postpartum blues (feelings of happy one minute  and sad another minute) are normal for the first few weeks but if it gets worse let your doctor know.   Congratulations! We enjoyed caring for you and your new bundle of joy!   Postpartum Care After Vaginal Delivery This sheet gives you information about how to care for yourself from the time you deliver your baby to up to 6-12 weeks after delivery (postpartum period). Your health care provider may also give you more specific instructions. If you have problems or questions, contact your health care provider. Follow these instructions at home: Vaginal bleeding  It is normal to have vaginal bleeding (lochia) after delivery. Wear a sanitary pad for vaginal bleeding and discharge. ? During the first week after delivery, the amount and appearance of lochia is often similar to a menstrual period. ? Over the next few weeks, it will gradually decrease to a dry, yellow-brown discharge. ? For most women, lochia stops completely by 4-6 weeks after delivery. Vaginal bleeding can vary from woman to woman.  Change your sanitary pads frequently. Watch for any changes in your flow, such as: ? A sudden increase in volume. ? A change in color. ? Large blood clots.  If you pass a blood clot from your vagina, save it and call your health care provider to discuss. Do not flush blood clots down the toilet before talking with your health care provider.  Do not use tampons or douches until your health care provider says  this is safe.  If you are not breastfeeding, your period should return 6-8 weeks after delivery. If you are feeding your child breast milk only (exclusive breastfeeding), your period may not return until you stop breastfeeding. Perineal care  Keep the area between the vagina and the anus (perineum) clean and dry as told by your health care provider. Use medicated pads and pain-relieving sprays and creams as directed.  If you had a cut in the perineum (episiotomy) or a tear in the vagina, check  the area for signs of infection until you are healed. Check for: ? More redness, swelling, or pain. ? Fluid or blood coming from the cut or tear. ? Warmth. ? Pus or a bad smell.  You may be given a squirt bottle to use instead of wiping to clean the perineum area after you go to the bathroom. As you start healing, you may use the squirt bottle before wiping yourself. Make sure to wipe gently.  To relieve pain caused by an episiotomy, a tear in the vagina, or swollen veins in the anus (hemorrhoids), try taking a warm sitz bath 2-3 times a day. A sitz bath is a warm water bath that is taken while you are sitting down. The water should only come up to your hips and should cover your buttocks. Breast care  Within the first few days after delivery, your breasts may feel heavy, full, and uncomfortable (breast engorgement). Milk may also leak from your breasts. Your health care provider can suggest ways to help relieve the discomfort. Breast engorgement should go away within a few days.  If you are breastfeeding: ? Wear a bra that supports your breasts and fits you well. ? Keep your nipples clean and dry. Apply creams and ointments as told by your health care provider. ? You may need to use breast pads to absorb milk that leaks from your breasts. ? You may have uterine contractions every time you breastfeed for up to several weeks after delivery. Uterine contractions help your uterus return to its normal size. ? If you have any problems with breastfeeding, work with your health care provider or [employer].  If you are not breastfeeding: ? Avoid touching your breasts a lot. Doing this can make your breasts produce more milk. ? Wear a good-fitting bra and use cold packs to help with swelling. ? Do not squeeze out (express) milk. This causes you to make more milk. Intimacy and sexuality  Ask your health care provider when you can engage in sexual activity. This may depend on: ? Your risk  of infection. ? How fast you are healing. ? Your comfort and desire to engage in sexual activity.  You are able to get pregnant after delivery, even if you have not had your period. If desired, talk with your health care provider about methods of birth control (contraception). Medicines  Take over-the-counter and prescription medicines only as told by your health care provider.  If you were prescribed an antibiotic medicine, take it as told by your health care provider. Do not stop taking the antibiotic even if you start to feel better. Activity  Gradually return to your normal activities as told by your health care provider. Ask your health care provider what activities are safe for you.  Rest as much as possible. Try to rest or take a nap while your baby is sleeping. Eating and drinking   Drink enough fluid to keep your urine pale yellow.  Eat high-fiber foods every day.  These may help prevent or relieve constipation. High-fiber foods include: ? Whole grain cereals and breads. ? Brown rice. ? Beans. ? Fresh fruits and vegetables.  Do not try to lose weight quickly by cutting back on calories.  Take your prenatal vitamins until your postpartum checkup or until your health care provider tells you it is okay to stop. Lifestyle  Do not use any products that contain nicotine or tobacco, such as cigarettes and e-cigarettes. If you need help quitting, ask your health care provider.  Do not drink alcohol, especially if you are breastfeeding. General instructions  Keep all follow-up visits for you and your baby as told by your health care provider. Most women visit their health care provider for a postpartum checkup within the first 3-6 weeks after delivery. Contact a health care provider if:  You feel unable to cope with the changes that your child brings to your life, and these feelings do not go away.  You feel unusually sad or worried.  Your breasts become red, painful, or  hard.  You have a fever.  You have trouble holding urine or keeping urine from leaking.  You have little or no interest in activities you used to enjoy.  You have not breastfed at all and you have not had a menstrual period for 12 weeks after delivery.  You have stopped breastfeeding and you have not had a menstrual period for 12 weeks after you stopped breastfeeding.  You have questions about caring for yourself or your baby.  You pass a blood clot from your vagina. Get help right away if:  You have chest pain.  You have difficulty breathing.  You have sudden, severe leg pain.  You have severe pain or cramping in your lower abdomen.  You bleed from your vagina so much that you fill more than one sanitary pad in one hour. Bleeding should not be heavier than your heaviest period.  You develop a severe headache.  You faint.  You have blurred vision or spots in your vision.  You have bad-smelling vaginal discharge.  You have thoughts about hurting yourself or your baby. If you ever feel like you may hurt yourself or others, or have thoughts about taking your own life, get help right away. You can go to the nearest emergency department or call:  Your local emergency services (911 in the [place]  A suicide crisis helpline, such as the National Suicide Prevention Lifeline at 610-845-8005. This is open 24 hours a day. Summary  The period of time right after you deliver your newborn up to 6-12 weeks after delivery is called the postpartum period.  Gradually return to your normal activities as told by your health care provider.  Keep all follow-up visits for you and your baby as told by your health care provider. This information is not intended to replace advice given to you by your health care provider. Make sure you discuss any questions you have with your health care provider. Document Revised: 02/12/2017 Document Reviewed: 11/23/2016 Elsevier Patient Education  2020  [employer]. Postpartum Baby Blues The postpartum period begins right after the birth of a baby. During this time, there is often a lot of joy and excitement. It is also a time of many changes in the life of the parents. No matter how many times a mother gives birth, each child brings new challenges to the family, including different ways of relating to one another. It is common to have feelings of excitement along with  confusing changes in moods, emotions, and thoughts. You may feel happy one minute and sad or stressed the next. These feelings of sadness usually happen in the period right after you have your baby, and they go away within a week or two. This is called the "baby blues." What are the causes? There is no known cause of baby blues. It is likely caused by a combination of factors. However, changes in hormone levels after childbirth are believed to trigger some of the symptoms. Other factors that can play a role in these mood changes include:  Lack of sleep.  Stressful life events, such as poverty, caring for a loved one, or death of a loved one.  Genetics. What are the signs or symptoms? Symptoms of this condition include:  Brief changes in mood, such as going from extreme happiness to sadness.  Decreased concentration.  Difficulty sleeping.  Crying spells and tearfulness.  Loss of appetite.  Irritability.  Anxiety. If the symptoms of baby blues last for more than 2 weeks or become more severe, you may have postpartum depression. How is this diagnosed? This condition is diagnosed based on an evaluation of your symptoms. There are no medical or lab tests that lead to a diagnosis, but there are various questionnaires that a health care provider may use to identify women with the baby blues or postpartum depression. How is this treated? Treatment is not needed for this condition. The baby blues usually go away on their own in 1-2 weeks. Social support is often all that is  needed. You will be encouraged to get adequate sleep and rest. Follow these instructions at home: Lifestyle      Get as much rest as you can. Take a nap when the baby sleeps.  Exercise regularly as told by your health care provider. Some women find yoga and walking to be helpful.  Eat a balanced and nourishing diet. This includes plenty of fruits and vegetables, whole grains, and lean proteins.  Do little things that you enjoy. Have a cup of tea, take a bubble bath, read your favorite magazine, or listen to your favorite music.  Avoid alcohol.  Ask for help with household chores, cooking, grocery shopping, or running errands. Do not try to do everything yourself. Consider hiring a postpartum doula to help. This is a professional who specializes in providing support to new mothers.  Try not to make any major life changes during pregnancy or right after giving birth. This can add stress. General instructions  Talk to people close to you about how you are feeling. Get support from your partner, family members, friends, or other new moms. You may want to join a support group.  Find ways to cope with stress. This may include: ? Writing your thoughts and feelings in a journal. ? Spending time outside. ? Spending time with people who make you laugh.  Try to stay positive in how you think. Think about the things you are grateful for.  Take over-the-counter and prescription medicines only as told by your health care provider.  Let your health care provider know if you have any concerns.  Keep all postpartum visits as told by your health care provider. This is important. Contact a health care provider if:  Your baby blues do not go away after 2 weeks. Get help right away if:  You have thoughts of taking your own life (suicidal thoughts).  You think you may harm the baby or other people.  You see or hear  things that are not there (hallucinations). Summary  After giving birth, you  may feel happy one minute and sad or stressed the next. Feelings of sadness that happen right after the baby is born and go away after a week or two are called the "baby blues."  You can manage the baby blues by getting enough rest, eating a healthy diet, exercising, spending time with supportive people, and finding ways to cope with stress.  If feelings of sadness and stress last longer than 2 weeks or get in the way of caring for your baby, talk to your health care provider. This may mean you have postpartum depression. This information is not intended to replace advice given to you by your health care provider. Make sure you discuss any questions you have with your health care provider. Document Revised: 06/03/2018 Document Reviewed: 04/07/2016 Elsevier Patient Education  2020 [employer].  Breastfeeding  Choosing to breastfeed is one of the best decisions you can make for yourself and your baby. A change in hormones during pregnancy causes your breasts to make breast milk in your milk-producing glands. Hormones prevent breast milk from being released before your baby is born. They also prompt milk flow after birth. Once breastfeeding has begun, thoughts of your baby, as well as his or her sucking or crying, can stimulate the release of milk from your milk-producing glands. Benefits of breastfeeding Research shows that breastfeeding offers many health benefits for infants and mothers. It also offers a cost-free and convenient way to feed your baby. For your baby  Your first milk (colostrum) helps your baby's digestive system to function better.  Special cells in your milk (antibodies) help your baby to fight off infections.  Breastfed babies are less likely to develop asthma, allergies, obesity, or type 2 diabetes. They are also at lower risk for sudden infant death syndrome (SIDS).  Nutrients in breast milk are better able to meet your baby's needs compared to infant formula.  Breast  milk improves your baby's brain development. For you  Breastfeeding helps to create a very special bond between you and your baby.  Breastfeeding is convenient. Breast milk costs nothing and is always available at the correct temperature.  Breastfeeding helps to burn calories. It helps you to lose the weight that you gained during pregnancy.  Breastfeeding makes your uterus return faster to its size before pregnancy. It also slows bleeding (lochia) after you give birth.  Breastfeeding helps to lower your risk of developing type 2 diabetes, osteoporosis, rheumatoid arthritis, cardiovascular disease, and breast, ovarian, uterine, and endometrial cancer later in life. Breastfeeding basics Starting breastfeeding  Find a comfortable place to sit or lie down, with your neck and back well-supported.  Place a pillow or a rolled-up blanket under your baby to bring him or her to the level of your breast (if you are seated). Nursing pillows are specially designed to help support your arms and your baby while you breastfeed.  Make sure that your baby's tummy (abdomen) is facing your abdomen.  Gently massage your breast. With your fingertips, massage from the outer edges of your breast inward toward the nipple. This encourages milk flow. If your milk flows slowly, you may need to continue this action during the feeding.  Support your breast with 4 fingers underneath and your thumb above your nipple (make the letter "C" with your hand). Make sure your fingers are well away from your nipple and your baby's mouth.  Stroke your baby's lips gently with your  finger or nipple.  When your baby's mouth is open wide enough, quickly bring your baby to your breast, placing your entire nipple and as much of the areola as possible into your baby's mouth. The areola is the colored area around your nipple. ? More areola should be visible above your baby's upper lip than below the lower lip. ? Your baby's lips should  be opened and extended outward (flanged) to ensure an adequate, comfortable latch. ? Your baby's tongue should be between his or her lower gum and your breast.  Make sure that your baby's mouth is correctly positioned around your nipple (latched). Your baby's lips should create a seal on your breast and be turned out (everted).  It is common for your baby to suck about 2-3 minutes in order to start the flow of breast milk. Latching Teaching your baby how to latch onto your breast properly is very important. An improper latch can cause nipple pain, decreased milk supply, and poor weight gain in your baby. Also, if your baby is not latched onto your nipple properly, he or she may swallow some air during feeding. This can make your baby fussy. Burping your baby when you switch breasts during the feeding can help to get rid of the air. However, teaching your baby to latch on properly is still the best way to prevent fussiness from swallowing air while breastfeeding. Signs that your baby has successfully latched onto your nipple  Silent tugging or silent sucking, without causing you pain. Infant's lips should be extended outward (flanged).  Swallowing heard between every 3-4 sucks once your milk has started to flow (after your let-down milk reflex occurs).  Muscle movement above and in front of his or her ears while sucking. Signs that your baby has not successfully latched onto your nipple  Sucking sounds or smacking sounds from your baby while breastfeeding.  Nipple pain. If you think your baby has not latched on correctly, slip your finger into the corner of your baby's mouth to break the suction and place it between your baby's gums. Attempt to start breastfeeding again. Signs of successful breastfeeding Signs from your baby  Your baby will gradually decrease the number of sucks or will completely stop sucking.  Your baby will fall asleep.  Your baby's body will relax.  Your baby will  retain a small amount of milk in his or her mouth.  Your baby will let go of your breast by himself or herself. Signs from you  Breasts that have increased in firmness, weight, and size 1-3 hours after feeding.  Breasts that are softer immediately after breastfeeding.  Increased milk volume, as well as a change in milk consistency and color by the fifth day of breastfeeding.  Nipples that are not sore, cracked, or bleeding. Signs that your baby is getting enough milk  Wetting at least 1-2 diapers during the first 24 hours after birth.  Wetting at least 5-6 diapers every 24 hours for the first week after birth. The urine should be clear or pale yellow by the age of 5 days.  Wetting 6-8 diapers every 24 hours as your baby continues to grow and develop.  At least 3 stools in a 24-hour period by the age of 5 days. The stool should be soft and yellow.  At least 3 stools in a 24-hour period by the age of 7 days. The stool should be seedy and yellow.  No loss of weight greater than 10% of birth weight during  the first 3 days of life.  Average weight gain of 4-7 oz (113-198 g) per week after the age of 4 days.  Consistent daily weight gain by the age of 5 days, without weight loss after the age of 2 weeks. After a feeding, your baby may spit up a small amount of milk. This is normal. Breastfeeding frequency and duration Frequent feeding will help you make more milk and can prevent sore nipples and extremely full breasts (breast engorgement). Breastfeed when you feel the need to reduce the fullness of your breasts or when your baby shows signs of hunger. This is called "breastfeeding on demand." Signs that your baby is hungry include:  Increased alertness, activity, or restlessness.  Movement of the head from side to side.  Opening of the mouth when the corner of the mouth or cheek is stroked (rooting).  Increased sucking sounds, smacking lips, cooing, sighing, or  squeaking.  Hand-to-mouth movements and sucking on fingers or hands.  Fussing or crying. Avoid introducing a pacifier to your baby in the first 4-6 weeks after your baby is born. After this time, you may choose to use a pacifier. Research has shown that pacifier use during the first year of a baby's life decreases the risk of sudden infant death syndrome (SIDS). Allow your baby to feed on each breast as long as he or she wants. When your baby unlatches or falls asleep while feeding from the first breast, offer the second breast. Because newborns are often sleepy in the first few weeks of life, you may need to awaken your baby to get him or her to feed. Breastfeeding times will vary from baby to baby. However, the following rules can serve as a guide to help you make sure that your baby is properly fed:  Newborns (babies 59 weeks of age or younger) may breastfeed every 1-3 hours.  Newborns should not go without breastfeeding for longer than 3 hours during the day or 5 hours during the night.  You should breastfeed your baby a minimum of 8 times in a 24-hour period. Breast milk pumping     Pumping and storing breast milk allows you to make sure that your baby is exclusively fed your breast milk, even at times when you are unable to breastfeed. This is especially important if you go back to work while you are still breastfeeding, or if you are not able to be present during feedings. Your lactation consultant can help you find a method of pumping that works best for you and give you guidelines about how long it is safe to store breast milk. Caring for your breasts while you breastfeed Nipples can become dry, cracked, and sore while breastfeeding. The following recommendations can help keep your breasts moisturized and healthy:  Avoid using soap on your nipples.  Wear a supportive bra designed especially for nursing. Avoid wearing underwire-style bras or extremely tight bras (sports  bras).  Air-dry your nipples for 3-4 minutes after each feeding.  Use only cotton bra pads to absorb leaked breast milk. Leaking of breast milk between feedings is normal.  Use lanolin on your nipples after breastfeeding. Lanolin helps to maintain your skin's normal moisture barrier. Pure lanolin is not harmful (not toxic) to your baby. You may also hand express a few drops of breast milk and gently massage that milk into your nipples and allow the milk to air-dry. In the first few weeks after giving birth, some women experience breast engorgement. Engorgement can make your  breasts feel heavy, warm, and tender to the touch. Engorgement peaks within 3-5 days after you give birth. The following recommendations can help to ease engorgement:  Completely empty your breasts while breastfeeding or pumping. You may want to start by applying warm, moist heat (in the shower or with warm, water-soaked hand towels) just before feeding or pumping. This increases circulation and helps the milk flow. If your baby does not completely empty your breasts while breastfeeding, pump any extra milk after he or she is finished.  Apply ice packs to your breasts immediately after breastfeeding or pumping, unless this is too uncomfortable for you. To do this: ? Put ice in a plastic bag. ? Place a towel between your skin and the bag. ? Leave the ice on for 20 minutes, 2-3 times a day.  Make sure that your baby is latched on and positioned properly while breastfeeding. If engorgement persists after 48 hours of following these recommendations, contact your health care provider or a Advertising copywriter. Overall health care recommendations while breastfeeding  Eat 3 healthy meals and 3 snacks every day. Well-nourished mothers who are breastfeeding need an additional 450-500 calories a day. You can meet this requirement by increasing the amount of a balanced diet that you eat.  Drink enough water to keep your urine pale  yellow or clear.  Rest often, relax, and continue to take your prenatal vitamins to prevent fatigue, stress, and low vitamin and mineral levels in your body (nutrient deficiencies).  Do not use any products that contain nicotine or tobacco, such as cigarettes and e-cigarettes. Your baby may be harmed by chemicals from cigarettes that pass into breast milk and exposure to secondhand smoke. If you need help quitting, ask your health care provider.  Avoid alcohol.  Do not use illegal drugs or marijuana.  Talk with your health care provider before taking any medicines. These include over-the-counter and prescription medicines as well as vitamins and herbal supplements. Some medicines that may be harmful to your baby can pass through breast milk.  It is possible to become pregnant while breastfeeding. If birth control is desired, ask your health care provider about options that will be safe while breastfeeding your baby. Where to find more information: [employer]: www.llli.org Contact a health care provider if:  You feel like you want to stop breastfeeding or have become frustrated with breastfeeding.  Your nipples are cracked or bleeding.  Your breasts are red, tender, or warm.  You have: ? Painful breasts or nipples. ? A swollen area on either breast. ? A fever or chills. ? Nausea or vomiting. ? Drainage other than breast milk from your nipples.  Your breasts do not become full before feedings by the fifth day after you give birth.  You feel sad and depressed.  Your baby is: ? Too sleepy to eat well. ? Having trouble sleeping. ? More than 31 week old and wetting fewer than 6 diapers in a 24-hour period. ? Not gaining weight by 65 days of age.  Your baby has fewer than 3 stools in a 24-hour period.  Your baby's skin or the white parts of his or her eyes become yellow. Get help right away if:  Your baby is overly tired (lethargic) and does not want to wake up  and feed.  Your baby develops an unexplained fever. Summary  Breastfeeding offers many health benefits for infant and mothers.  Try to breastfeed your infant when he or she shows early signs of  hunger.  Gently tickle or stroke your baby's lips with your finger or nipple to allow the baby to open his or her mouth. Bring the baby to your breast. Make sure that much of the areola is in your baby's mouth. Offer one side and burp the baby before you offer the other side.  Talk with your health care provider or lactation consultant if you have questions or you face problems as you breastfeed. This information is not intended to replace advice given to you by your health care provider. Make sure you discuss any questions you have with your health care provider. Document Revised: 05/06/2017 Document Reviewed: 03/13/2016 Elsevier Patient Education  2020 [employer].

## 2020-01-31 NOTE — Lactation Note (Signed)
This note was copied from a baby's chart. Lactation Consultation Note  Patient Name: Taylor Lucas EGBTD'V Date: 01/31/2020 Reason for consult: Follow-up assessment;Term;Infant < 6lbs;Other (Comment) (nipple shield)  Lactation follow-up before anticipated discharge. Mom and baby doing well, mom reports cluster feeding overnight, baby accepting both breasts well, and colostrum visible in shield post feeds. Void/stools exceed minimum expectations for HOL. LC reviewed BF basics: encouraged feeding on demand, newborn stomach size, feeding patterns and behaviors, cluster feeding and growth spurts, and ongoing output expectations. Reviewed normal course of lactation, milk supply and demand, and use of pump PRN after or between feedings for comfort. Discussed breast fullness/engorgement and management of both along with nipple care. Encouraged to delay introduction of artifical nipples and pacifier and their impact on establishment of breastfeeding. Information given for outpatient lactation services and community breastfeeding support. Encouraged to call out today with questions/concerns or for BF support if needed.  Maternal Data Formula Feeding for Exclusion: No Has patient been taught Hand Expression?: Yes Does the patient have breastfeeding experience prior to this delivery?: Yes  Feeding Feeding Type: Breast Fed  LATCH Score                   Interventions Interventions: Breast feeding basics reviewed  Lactation Tools Discussed/Used Tools: Nipple Shields Nipple shield size: 20   Consult Status Consult Status: Complete Date: 01/31/20 Follow-up type: In-patient    Danford Bad 01/31/2020, 9:38 AM

## 2020-01-31 NOTE — Progress Notes (Signed)
Patient discharged home with infant. Discharge instructions and prescriptions given and reviewed with patient. Patient verbalized understanding. Escorted out by auxillary.  

## 2020-02-24 DIAGNOSIS — Z419 Encounter for procedure for purposes other than remedying health state, unspecified: Secondary | ICD-10-CM | POA: Diagnosis not present

## 2020-03-15 ENCOUNTER — Other Ambulatory Visit: Payer: Self-pay

## 2020-03-15 ENCOUNTER — Encounter: Payer: Self-pay | Admitting: Obstetrics and Gynecology

## 2020-03-15 ENCOUNTER — Ambulatory Visit (INDEPENDENT_AMBULATORY_CARE_PROVIDER_SITE_OTHER): Payer: BC Managed Care – PPO | Admitting: Obstetrics and Gynecology

## 2020-03-15 DIAGNOSIS — Z30430 Encounter for insertion of intrauterine contraceptive device: Secondary | ICD-10-CM

## 2020-03-15 NOTE — Progress Notes (Signed)
Postpartum Visit  Chief Complaint:  Chief Complaint  Patient presents with  . Postpartum Care    History of Present Illness: Patient is a 25 y.o. [id number] presents for postpartum visit and Mirena IUD insertion. Patient denies additional concerns today.  Date of delivery: 01/29/20 Type of delivery: Vaginal delivery - Vacuum or forceps assisted  no Episiotomy No.  Laceration: yes  Pregnancy or labor problems:  no Any problems since the delivery:  no  Newborn Details:  SINGLETON :  1. BabyGender female.  Maternal Details:  Breast or formula feeding: formula feeding Intercourse: No  Contraception after delivery: No  Any bowel or bladder issues: No  Post partum depression/anxiety noted:  no Edinburgh Post-Partum Depression Score: 0 Date of last PAP: 06/26/19  NILM   Review of Systems: Review of Systems  Constitutional: Negative.   HENT: Negative.   Eyes: Negative.   Respiratory: Negative.   Cardiovascular: Negative.   Gastrointestinal: Negative.   Genitourinary: Negative.   Musculoskeletal: Negative.   Skin: Negative.   Neurological: Negative.   Endo/Heme/Allergies: Negative.   Psychiatric/Behavioral: Negative.     The following portions of the patient's history were reviewed and updated as appropriate: allergies, current medications, past family history, past medical history, past surgical history and problem list.  Past Medical History:  Past Medical History:  Diagnosis Date  . Dysmenorrhea   . Family history of breast cancer    mom age 65; BRCA neg; pt qualifies for update testing when older  . Rib fracture 10/2008   sports injury    Past Surgical History:  Past Surgical History:  Procedure Laterality Date  . DILATION AND CURETTAGE OF UTERUS    . nexplanon     2015    Family History:  Family History  Problem Relation Age of Onset  . Breast cancer Mother 77       BRCA neg  . Hypertension Father   . Kidney Stones Father   . Renal cancer Paternal  Grandmother   . Diabetes Paternal Grandfather   . Stroke Maternal Grandfather     Social History:  Social History   Socioeconomic History  . Marital status: Married    Spouse name: Legrand Como  . Number of children: 0  . Years of education: Not on file  . Highest education level: Not on file  Occupational History  . Not on file  Tobacco Use  . Smoking status: Never Smoker  . Smokeless tobacco: Never Used  Vaping Use  . Vaping Use: Never used  Substance and Sexual Activity  . Alcohol use: Yes  . Drug use: No  . Sexual activity: Not Currently  Other Topics Concern  . Not on file  Social History Narrative  . Not on file   Social Determinants of Health   Financial Resource Strain: Not on file  Food Insecurity: Not on file  Transportation Needs: Not on file  Physical Activity: Not on file  Stress: Not on file  Social Connections: Not on file  Intimate Partner Violence: Not on file    Allergies:  No Known Allergies  Medications: Prior to Admission medications   Medication Sig Start Date End Date Taking? Authorizing Provider  doxylamine, Sleep, (UNISOM) 25 MG tablet Take 25 mg by mouth at bedtime as needed.   Yes [provider]  ibuprofen (ADVIL) 600 MG tablet Take 1 tablet (600 mg total) by mouth every 6 (six) hours. 01/31/20  Yes [name], CNM  Prenatal Vit-Fe Fumarate-FA [facility])  27-0.8 MG TABS tablet Take 1 tablet by mouth daily at 12 noon.   Yes [provider]    Physical Exam Blood pressure 110/70, height 5' (1.524 m), weight 172 lb (78 kg), last menstrual period 03/01/2020.    General: NAD HEENT: normocephalic, anicteric Pulmonary: No increased work of breathing Abdomen: NABS, soft, non-tender, non-distended.  Umbilicus without lesions.  No hepatomegaly, splenomegaly or masses palpable. No evidence of hernia. Genitourinary:  External: Normal external female genitalia.  Normal urethral meatus, normal  Bartholin's and  Skene's glands.    Vagina: Normal vaginal mucosa, no evidence of prolapse.    Cervix: Grossly normal in appearance, no bleeding  Uterus: Non-enlarged, mobile, normal contour.  No CMT  Adnexa: ovaries non-enlarged, no adnexal masses  Rectal: deferred Extremities: no edema, erythema, or tenderness Neurologic: Grossly intact Psychiatric: mood appropriate, affect full   IUD Insertion Procedure Note Patient identified, informed consent performed, consent signed.   Discussed risks of irregular bleeding, cramping, infection, malpositioning or misplacement of the IUD outside the uterus which may require further procedure such as laparoscopy, risk of failure <1%. Time out was performed.  Patient denies intercourse since time of delivery.  A bimanual exam showed the uterus to be midposition.  Speculum placed in the vagina.  Cervix visualized.  Cleaned with Betadine x 2.  Grasped anteriorly with a single tooth tenaculum.  Uterus sounded to 7.5 cm.   Mirena IUD placed per manufacturer's recommendations.  Strings trimmed to 3 cm. Tenaculum was removed, good hemostasis noted.  Patient tolerated procedure well.   Patient was given post-procedure instructions.  She was advised to have backup contraception for one week.  Patient was also asked to check IUD strings periodically and follow up in 4 weeks for IUD check.  Orlie Pollen, CNM, MSN [facility], [facility] 03/15/2020  1:54 PM    Assessment: 25 y.o. [id number] presenting for 6 week postpartum visit and Mirena IUD insertion.    Plan: Problem List Items Addressed This Visit      Other   Postpartum care following vaginal delivery - Primary    Other Visit Diagnoses    Encounter for insertion of mirena IUD           1) Contraception - Education given regarding options for contraception. Mirena IUD placed today.  2)  Pap - ASCCP guidelines and rational discussed.  ASCCP guidelines and rational discussed.  Patient opts for  every 3 years screening interval  3) Patient underwent screening for postpartum depression with no signs of depression  4) Return in about 4 weeks (around 04/12/2020) for IUD String check.   Orlie Pollen, CNM, MSN [facility], [facility] 03/15/2020, 1:53 PM

## 2020-03-26 DIAGNOSIS — Z419 Encounter for procedure for purposes other than remedying health state, unspecified: Secondary | ICD-10-CM | POA: Diagnosis not present

## 2020-04-12 ENCOUNTER — Other Ambulatory Visit: Payer: Self-pay

## 2020-04-12 ENCOUNTER — Ambulatory Visit (INDEPENDENT_AMBULATORY_CARE_PROVIDER_SITE_OTHER): Payer: BLUE CROSS/BLUE SHIELD | Admitting: Obstetrics and Gynecology

## 2020-04-12 ENCOUNTER — Encounter: Payer: Self-pay | Admitting: Obstetrics and Gynecology

## 2020-04-12 VITALS — BP 110/70 | Ht 60.0 in | Wt 174.0 lb

## 2020-04-12 DIAGNOSIS — Z30431 Encounter for routine checking of intrauterine contraceptive device: Secondary | ICD-10-CM

## 2020-04-12 NOTE — Progress Notes (Signed)
   Patient ID: Taylor Lucas, female   DOB: 20-Jan-1996, 25 y.o.   MRN: [id number]  Reason for Consult: Follow-up (IUD string check, still having light bleeding, discuss BTL - RM 4)   Referred by No ref. provider found  Subjective:     HPI:  Taylor Lucas is a 25 y.o. female who present 1 month following IUD placement for string check. Patient states that she has been spotting daily, enough flow to wear a tampon, since IUD was placed. Patient denies cramping. Denies menstrual cycle bleeding. Patient reports being sexually active, partner has denied sensation of IUD strings. Patient desires more information on BTL.  Gynecological History  Menopause: n/a LMP: n/a Describes periods as n/a Last pap smear: 06/26/19 - NILM Last Mammogram: n/a History of STDs: n/a Sexually Active: yes  Obstetrical History [id number]  Past Medical History:  Diagnosis Date  . Dysmenorrhea   . Family history of breast cancer    mom age 46; BRCA neg; pt qualifies for update testing when older  . Rib fracture 10/2008   sports injury   Family History  Problem Relation Age of Onset  . Breast cancer Mother 77       BRCA neg  . Hypertension Father   . Kidney Stones Father   . Renal cancer Paternal Grandmother   . Diabetes Paternal Grandfather   . Stroke Maternal Grandfather    Past Surgical History:  Procedure Laterality Date  . DILATION AND CURETTAGE OF UTERUS    . nexplanon     2015    Short Social History:  Social History   Tobacco Use  . Smoking status: Never Smoker  . Smokeless tobacco: Never Used  Substance Use Topics  . Alcohol use: Yes    No Known Allergies  Current Outpatient Medications  Medication Sig Dispense Refill  . ibuprofen (ADVIL) 600 MG tablet Take 1 tablet (600 mg total) by mouth every 6 (six) hours. 30 tablet 0  . doxylamine, Sleep, (UNISOM) 25 MG tablet Take 25 mg by mouth at bedtime as needed. (Patient not taking: Reported on 04/12/2020)    . Prenatal Vit-Fe  Fumarate-FA (MULTIVITAMIN-PRENATAL) 27-0.8 MG TABS tablet Take 1 tablet by mouth daily at 12 noon. (Patient not taking: Reported on 04/12/2020)     No current facility-administered medications for this visit.    Review of Systems  Constitutional:  Constitutional negative. GU:       Daily, light, vaginal spotting All other systems reviewed and are negative       Objective:  Objective   Vitals:   04/12/20 1344  BP: 110/70  Weight: 174 lb (78.9 kg)  Height: 5' (1.524 m)   Body mass index is 33.98 kg/m.  Physical Exam Vitals reviewed.  Constitutional:      Appearance: Normal appearance.  Pulmonary:     Effort: Pulmonary effort is normal.  Genitourinary:    Comments: External: Normal appearing vulva. No lesions noted.  Speculum examination: Normal appearing cervix. Scant blood in the vaginal vault. No discharge note. IUD strings visualized. Neurological:     Mental Status: She is alert.     Assessment/Plan:     25 yo [id number] s/p IUD placement. IUD strings visualized on exam.  Reviewed SE profile of IUD. Discussed expectations for spotting for 3-6 months following placement. Patient to f/u with MD to discuss BTL procedure.     Orlie Pollen, [facility], [facility] 04/12/2020 1:59 PM

## 2020-04-23 DIAGNOSIS — Z419 Encounter for procedure for purposes other than remedying health state, unspecified: Secondary | ICD-10-CM | POA: Diagnosis not present

## 2020-04-26 ENCOUNTER — Other Ambulatory Visit: Payer: Self-pay

## 2020-04-26 ENCOUNTER — Ambulatory Visit (INDEPENDENT_AMBULATORY_CARE_PROVIDER_SITE_OTHER): Payer: BC Managed Care – PPO | Admitting: Obstetrics and Gynecology

## 2020-04-26 ENCOUNTER — Encounter: Payer: Self-pay | Admitting: Obstetrics and Gynecology

## 2020-04-26 ENCOUNTER — Telehealth: Payer: Self-pay

## 2020-04-26 VITALS — BP 113/70 | Wt 172.0 lb

## 2020-04-26 DIAGNOSIS — Z3009 Encounter for other general counseling and advice on contraception: Secondary | ICD-10-CM | POA: Diagnosis not present

## 2020-04-26 NOTE — Progress Notes (Signed)
Patient ID: Taylor Lucas, female   DOB: [birth year], 25 y.o.   MRN: [id number]  Reason for Consult: Gynecologic Exam   Referred by No ref. provider found  Subjective:     HPI:  Taylor Lucas is a 25 y.o. female. She would like a sterilization procedure. She feels certain that she is done having children.    Past Medical History:  Diagnosis Date  . Dysmenorrhea   . Family history of breast cancer    mom age 24; BRCA neg; pt qualifies for update testing when older  . Rib fracture 10/2008   sports injury   Family History  Problem Relation Age of Onset  . Breast cancer Mother 58       BRCA neg  . Hypertension Father   . Kidney Stones Father   . Renal cancer Paternal Grandmother   . Diabetes Paternal Grandfather   . Stroke Maternal Grandfather    Past Surgical History:  Procedure Laterality Date  . DILATION AND CURETTAGE OF UTERUS    . nexplanon     2015    Short Social History:  Social History   Tobacco Use  . Smoking status: Never Smoker  . Smokeless tobacco: Never Used  Substance Use Topics  . Alcohol use: Yes    No Known Allergies  Current Outpatient Medications  Medication Sig Dispense Refill  . doxylamine, Sleep, (UNISOM) 25 MG tablet Take 25 mg by mouth at bedtime as needed. (Patient not taking: Reported on 04/12/2020)    . ibuprofen (ADVIL) 600 MG tablet Take 1 tablet (600 mg total) by mouth every 6 (six) hours. (Patient not taking: Reported on 04/26/2020) 30 tablet 0  . Prenatal Vit-Fe Fumarate-FA (MULTIVITAMIN-PRENATAL) 27-0.8 MG TABS tablet Take 1 tablet by mouth daily at 12 noon. (Patient not taking: Reported on 04/12/2020)     No current facility-administered medications for this visit.    Review of Systems  Constitutional: Negative for chills, fatigue, fever and unexpected weight change.  HENT: Negative for trouble swallowing.  Eyes: Negative for loss of vision.  Respiratory: Negative for cough, shortness of breath and wheezing.   Cardiovascular: Negative for chest pain, leg swelling, palpitations and syncope.  GI: Negative for abdominal pain, blood in stool, diarrhea, nausea and vomiting.  GU: Negative for difficulty urinating, dysuria, frequency and hematuria.  Musculoskeletal: Negative for back pain, leg pain and joint pain.  Skin: Negative for rash.  Neurological: Negative for dizziness, headaches, light-headedness, numbness and seizures.  Psychiatric: Negative for behavioral problem, confusion, depressed mood and sleep disturbance.        Objective:  Objective   Vitals:   04/26/20 1059  BP: 113/70  Weight: 172 lb (78 kg)   Body mass index is 33.59 kg/m.  Physical Exam Vitals and nursing note reviewed. Exam conducted with a chaperone present.  Constitutional:      Appearance: Normal appearance.  HENT:     Head: Normocephalic and atraumatic.  Eyes:     Extraocular Movements: Extraocular movements intact.     Pupils: Pupils are equal, round, and reactive to light.  Cardiovascular:     Rate and Rhythm: Normal rate and regular rhythm.  Pulmonary:     Effort: Pulmonary effort is normal.     Breath sounds: Normal breath sounds.  Abdominal:     General: Abdomen is flat.     Palpations: Abdomen is soft.  Musculoskeletal:     Cervical back: Normal range of motion.  Skin:    General: Skin  is warm and dry.  Neurological:     General: No focal deficit present.     Mental Status: She is alert and oriented to person, place, and time.  Psychiatric:        Behavior: Behavior normal.        Thought Content: Thought content normal.        Judgment: Judgment normal.     Assessment/Plan:    25 y.o. [id number]  Patient reports that she desires sterilization. She feels strongly that she does not desire children in the future.   We discussed alternative options for sterilization including long-acting reversible contraception methods. We discussed that sterilization procedures have a failure rate of  approximately 1 in  1000.  We discussed potential surgical complications of sterile sterilization including infection damage to surrounding pelvic tissues and risk of bleeding.We discussed that sterilization procedure should be considered on reversible.  Having a reversal surgery to correct a tubal ligation is expensive, risks and ectopic pregnancy, and has high failure rates.  We discussed options of performing a tubal ligation including removing a segment of the fallopian tube or removing an entire fallopian tube.  We discussed that removal of entire fallopian tube has been associated with a reduction in ovarian cancer risk, however this reduction in is small in her lifetime risk of ovarian cancer is approximately 1 in 100.   Patient feels sure of her decision to have a sterilization procedure.  She desires to have a laparoscopic bilateral salpingectomy.  Note sent to surgical scheduler who will reach out to the patient to schedule surgery.  Tubal consents signed   Adrian Prows MD, [place], [place] 04/26/2020 1:55 PM

## 2020-04-26 NOTE — Telephone Encounter (Signed)
Called pt to schedule for surgery with [facility]. Pt has [employer] through her father until she turns 81 on 05/21/20. She also has [employer] as secondary ins.   She requests a bilateral salpingectomy for sterilization. She did sign a BTL form today but I advised that with medicaid there is a 30 day wait before having procedure done. She would not have enough time to use the [facility] as secondary coverage. I explained that Jerene Pitch can do surgery on 3/15 and that it could be files to the [facility]. I show that she has a $300 ded and then the plan is 80/20. Adv that she is responsible for her ded and then her portion of the allowed amount.  She wanted to talk to her husband and adv she would call back if they decided to schedule surgery.

## 2020-04-26 NOTE — Patient Instructions (Signed)
Laparoscopic Tubal Ligation Laparoscopic tubal ligation is a procedure to close the fallopian tubes. This is done to prevent pregnancy. When the fallopian tubes are closed, the eggs that your ovaries release cannot enter the uterus, and sperm cannot reach the released eggs. You should not have this procedure if you want to get pregnant someday or if you are unsure about having more children. Tell a health care provider about:  Any allergies you have.  All medicines you are taking, including vitamins, herbs, eye drops, creams, and over-the-counter medicines.  Any problems you or family members have had with anesthetic medicines.  Any blood disorders you have.  Any surgeries you have had.  Any medical conditions you have.  Whether you are pregnant or may be pregnant.  Any past pregnancies. What are the risks? Generally, this is a safe procedure. However, problems may occur, including:  Infection.  Bleeding.  Injury to other organs in the abdomen.  Side effects from anesthetic medicines.  Failure of the procedure. This procedure can increase your risk of an ectopic pregnancy. This is a pregnancy in which a fertilized egg attaches to the outside of the uterus. What happens before the procedure? Staying hydrated Follow instructions from your health care provider about hydration, which may include:  Up to 2 hours before the procedure - you may continue to drink clear liquids, such as water, clear fruit juice, black coffee, and plain tea. Eating and drinking restrictions Follow instructions from your health care provider about eating and drinking, which may include:  8 hours before the procedure - stop eating heavy meals or foods, such as meat, fried foods, or fatty foods.  6 hours before the procedure - stop eating light meals or foods, such as toast or cereal.  6 hours before the procedure - stop drinking milk or drinks that contain milk.  2 hours before the procedure - stop  drinking clear liquids. Medicines Ask your health care provider about:  Changing or stopping your regular medicines. This is especially important if you are taking diabetes medicines or blood thinners.  Taking medicines such as aspirin and ibuprofen. These medicines can thin your blood. Do not take these medicines unless your health care provider tells you to take them.  Taking over-the-counter medicines, vitamins, herbs, and supplements. Surgery safety Ask your health care provider:  How your surgery site will be marked.  What steps will be taken to help prevent infection. These steps may include: ? Removing hair at the surgery site. ? Washing skin with a germ-killing soap. ? Taking antibiotic medicine. General instructions  Do not use any products that contain nicotine or tobacco for at least 4 weeks before the procedure. These products include cigarettes, chewing tobacco, and vaping devices, such as e-cigarettes. If you need help quitting, ask your health care provider.  Plan to have someone take you home from the hospital.  If you will be going home right after the procedure, plan to have a responsible adult care for you for the time you are told. This is important. What happens during the procedure?  An IV will be inserted into one of your veins.  You will be given one or more of the following: ? A medicine to help you relax (sedative). ? A medicine to numb the area (local anesthetic). ? A medicine to make you fall asleep (general anesthetic). ? A medicine that is injected into an area of your body to numb everything below the injection site (regional anesthetic).  Your bladder   may be emptied with a small tube (catheter).  If you have been given a general anesthetic, a tube will be put down your throat to help you breathe.  Two small incisions will be made in your lower abdomen and near your belly button.  Your abdomen will be inflated with a gas. This will let the  surgeon see better and will give the surgeon room to work.  A lighted tube with camera (laparoscope) will be inserted into your abdomen through one of the incisions. Small instruments will be inserted through the other incision.  The fallopian tubes will be tied off, burned (cauterized), or blocked with a clip, ring, or clamp. A small portion in the center of each fallopian tube may be removed.  The gas will be released from the abdomen.  The incisions will be closed with stitches (sutures).  A bandage (dressing) will be placed over the incisions. The procedure may vary among health care providers and hospitals.      What happens after the procedure?  Your blood pressure, heart rate, breathing rate, and blood oxygen level will be monitored until you leave the hospital.  You will be given medicine to help with pain, nausea, and vomiting as needed.  You may have vaginal discharge after the procedure. You may need to wear a sanitary napkin.  If you were given a sedative during the procedure, it can affect you for several hours. Do not drive or operate machinery until your health care provider says that it is safe. Summary  Laparoscopic tubal ligation is a procedure that is done to prevent pregnancy.  You should not have this procedure if you want to get pregnant someday or if you are unsure about having more children.  The procedure is done using a thin, lighted tube (laparoscope) with a camera attached that will be inserted into your abdomen through an incision.  After the procedure you will be given medicine to help with pain, nausea, and vomiting as needed.  Plan to have someone take you home from the hospital. This information is not intended to replace advice given to you by your health care provider. Make sure you discuss any questions you have with your health care provider. Document Revised: 10/27/2019 Document Reviewed: 10/27/2019 Elsevier Patient Education  2021 [employer] Laparoscopic Tubal Ligation, Care After The following information offers guidance on how to care for yourself after your procedure. Your health care provider may also give you more specific instructions. If you have problems or questions, contact your health care provider. What can I expect after the procedure? After the procedure, it is common to have:  A sore throat if general anesthesia was used.  Pain in shoulders, back, and abdomen. This is caused by the gas that was used during the procedure.  Mild discomfort or cramping in your abdomen.  Pain or soreness in the area where the surgical incision was made.  A bloated feeling.  Tiredness.  Nausea and vomiting. Follow these instructions at home: Medicines  Take over-the-counter and prescription medicines only as told by your health care provider.  Ask your health care provider if the medicine prescribed to you: ? Requires you to avoid driving or using heavy machinery. ? Can cause constipation. You may need to take these actions to prevent or treat constipation:  Drink enough fluid to keep your urine pale yellow.  Take over-the-counter or prescription medicines.  Eat foods that are high in fiber, such as beans, whole grains, and fresh fruits  and vegetables.  Limit foods that are high in fat and processed sugars, such as fried or sweet foods.  Do not take aspirin because it can cause bleeding. Incision care  Follow instructions from your health care provider about how to take care of your incision. Make sure you: ? Wash your hands with soap and water for at least 20 seconds before and after you change your bandage (dressing). If soap and water are not available, use hand sanitizer. ? Change your dressing as told by your health care provider. ? Leave stitches (sutures), skin glue, or adhesive strips in place. These skin closures may need to stay in place for 2 weeks or longer. If adhesive strip edges start to loosen and  curl up, you may trim the loose edges. Do not remove adhesive strips completely unless your health care provider tells you to do that.  Check your incision area every day for signs of infection. Check for: ? Redness, swelling, or more pain. ? Fluid or blood. ? Warmth. ? Pus or a bad smell.      Activity  Rest as told by your health care provider.  Avoid sitting for a long time without moving. Get up to take short walks every 1-2 hours. This is important to improve blood flow and breathing. Ask for help if you feel weak or unsteady.  Do not have sex, douche, or put a tampon or anything else in your vagina for 6 weeks or as long as told by your health care provider.  Do not lift anything that is heavier than your baby for 2 weeks, or the limit that you are told, until your health care provider says that it is safe.  Do not take baths, swim, or use a hot tub until your health care provider approves. Ask your health care provider if you may take showers. You may only be allowed to take sponge baths.  Return to your normal activities as told by your health care provider. Ask your health care provider what activities are safe for you. General instructions  After the procedure you may need to wear a sanitary pad for vaginal discharge.  Have someone help you with your daily household tasks for the first few days.  Keep all follow-up visits. This is important. Contact a health care provider if:  You have redness, swelling, or more pain around your incision.  Your incision feels warm to the touch.  You have pus or a bad smell coming from your incision.  The edges of your incision break open after the sutures have been removed.  Your pain does not improve after 2-3 days.  You have a rash.  You repeatedly become dizzy or light-headed.  Your pain medicine is not helping. Get help right away if:  You have a fever or chills.  You faint.  You have increasing pain in your  abdomen.  You have severe pain in one or both of your shoulders.  You have fluid or blood coming from your sutures or heavy bleeding from your vagina.  You have shortness of breath or difficulty breathing.  You have chest pain, leg pain, or leg swelling.  You have ongoing nausea, vomiting, or diarrhea. These symptoms may represent a serious problem that is an emergency. Do not wait to see if the symptoms will go away. Get medical help right away. Call your local emergency services (911 in the [place] Do not drive yourself to the hospital. Summary  After the procedure, it is  common to have mild discomfort or cramping in your abdomen.  After the procedure you may need to wear a sanitary pad for vaginal discharge.  Take over-the-counter and prescription medicines only as told by your health care provider.  Watch for symptoms that should prompt you to call your health care provider.  Keep all follow-up visits. This is important. This information is not intended to replace advice given to you by your health care provider. Make sure you discuss any questions you have with your health care provider. Document Revised: 10/27/2019 Document Reviewed: 10/27/2019 Elsevier Patient Education  2021 [employer].

## 2020-04-26 NOTE — Telephone Encounter (Signed)
-----   Message from Natale Milch, MD sent at 04/26/2020 11:54 AM EST ----- Surgery Booking Request Patient Full Name:  Taylor Lucas  MRN: [id number]  DOB: 05-21-1995  Surgeon: Natale Milch, MD  Requested Surgery Date and Time: ASAP Primary Diagnosis AND Code: desires sterilization Secondary Diagnosis and Code:  Surgical Procedure: Robotic assisted bilateral salpingectomy RNFA Requested?: No L&D Notification: No Admission Status: same day surgery Length of Surgery: 50 min Special Case Needs: No H&P: No Phone Interview???:  Yes Interpreter: No Medical Clearance:  No Special Scheduling Instructions: No Any known health/anesthesia issues, diabetes, sleep apnea, latex allergy, defibrillator/pacemaker?: No Acuity: P2   (P1 highest, P2 delay may cause harm, P3 low, elective gyn, P4 lowest)

## 2020-05-24 DIAGNOSIS — Z419 Encounter for procedure for purposes other than remedying health state, unspecified: Secondary | ICD-10-CM | POA: Diagnosis not present

## 2020-06-23 DIAGNOSIS — Z419 Encounter for procedure for purposes other than remedying health state, unspecified: Secondary | ICD-10-CM | POA: Diagnosis not present

## 2020-07-24 DIAGNOSIS — Z419 Encounter for procedure for purposes other than remedying health state, unspecified: Secondary | ICD-10-CM | POA: Diagnosis not present

## 2020-08-23 DIAGNOSIS — Z419 Encounter for procedure for purposes other than remedying health state, unspecified: Secondary | ICD-10-CM | POA: Diagnosis not present

## 2023-11-23 NOTE — Patient Instructions (Signed)
 How to Do a Breast Self-Exam  Doing breast self-exams can help you stay healthy. They're one way to know what's normal for your breasts. They can help you catch a problem while it's still small and can be treated. You need to: Check your breasts often. Tell your doctor about any changes. You should do breast self-exams even if you have breast implants. What you need: A mirror. A well-lit room. A pillow or other soft object. How to do a breast self-exam Look for changes  Take off all the clothes above your waist. Stand in front of a mirror in a room with good lighting. Put your hands down at your sides. Compare your breasts in the mirror. Look for difference between them, such as: Differences in shape. Differences in size. Wrinkles, dips, and bumps in one breast and not the other. Look at each breast for skin changes, such as: Redness. Scaly spots. Spots where your skin is thicker. Dimpling. Open sores. Look for changes in your nipples, such as: Fluid coming out of a nipple. Fluid around a nipple. Bleeding. Dimpling. Redness. A nipple that looks pushed in or that has changed position. Feel for changes Lie on your back. Feel each breast. To do this: Pick a breast to feel. Place a pillow under the shoulder closest to that breast. Put the arm closest to that breast behind your head. Feel the breast using the hand of your other arm. Use the pads of your three middle fingers to make small circles starting near the nipple. Use light, medium, and firm pressure. Keep making circles, moving down over the breast. Stop when you feel your ribs. Start making circles with your fingers again, this time going up until you reach your collarbone. Then, make circles out across your breast and into your armpit area. Squeeze your nipple. Check for fluid and lumps. Do these steps again to check your other breast. Sit or stand in the tub or shower. With soapy water on your skin, feel each  breast the same way you did when you were lying down. Write down what you find Writing down what you find can help you keep track of what you want to tell your doctor. Write down: What's normal for each breast. Any changes you find. Write down: The kind of change. If your breast feels tender or painful. Any lump you find. Write down its size and where it is. When you last had your period. General tips If you're breastfeeding, the best time to check your breasts is after you feed your baby or after you use a breast pump. If you get a period, the best time to check your breasts is 5-7 days after your period ends. With time, you'll get more used to doing the self-exam. You'll also start to know if there are changes in your breasts. Contact a doctor if: You see a change in the shape or size of your breasts or nipples. You see a change in the skin of your breast or nipples. You have fluid coming from your nipples that isn't normal. You find a new lump or thick area. You have breast pain. You have any concerns about your breast health. This information is not intended to replace advice given to you by your health care provider. Make sure you discuss any questions you have with your health care provider. Document Revised: 04/21/2023 Document Reviewed: 04/21/2023 Elsevier Patient Education  2025 [employer].  Preventive Care 29-65 Years Old, Female Preventive care refers to lifestyle  choices and visits with your health care provider that can promote health and wellness. Preventive care visits are also called wellness exams. What can I expect for my preventive care visit? Counseling During your preventive care visit, your health care provider may ask about your: Medical history, including: Past medical problems. Family medical history. Pregnancy history. Current health, including: Menstrual cycle. Method of birth control. Emotional well-being. Home life and relationship  well-being. Sexual activity and sexual health. Lifestyle, including: Alcohol, nicotine  or tobacco, and drug use. Access to firearms. Diet, exercise, and sleep habits. Work and work Astronomer. Sunscreen use. Safety issues such as seatbelt and bike helmet use. Physical exam Your health care provider may check your: Height and weight. These may be used to calculate your BMI (body mass index). BMI is a measurement that tells if you are at a healthy weight. Waist circumference. This measures the distance around your waistline. This measurement also tells if you are at a healthy weight and may help predict your risk of certain diseases, such as type 2 diabetes and high blood pressure. Heart rate and blood pressure. Body temperature. Skin for abnormal spots. What immunizations do I need?  Vaccines are usually given at various ages, according to a schedule. Your health care provider will recommend vaccines for you based on your age, medical history, and lifestyle or other factors, such as travel or where you work. What tests do I need? Screening Your health care provider may recommend screening tests for certain conditions. This may include: Pelvic exam and Pap test. Lipid and cholesterol levels. Diabetes screening. This is done by checking your blood sugar (glucose) after you have not eaten for a while (fasting). Hepatitis B test. Hepatitis C test. HIV (human immunodeficiency virus) test. STI (sexually transmitted infection) testing, if you are at risk. BRCA-related cancer screening. This may be done if you have a family history of breast, ovarian, tubal, or peritoneal cancers. Talk with your health care provider about your test results, treatment options, and if necessary, the need for more tests. Follow these instructions at home: Eating and drinking  Eat a healthy diet that includes fresh fruits and vegetables, whole grains, lean protein, and low-fat dairy products. Take vitamin and  mineral supplements as recommended by your health care provider. Do not drink alcohol if: Your health care provider tells you not to drink. You are pregnant, may be pregnant, or are planning to become pregnant. If you drink alcohol: Limit how much you have to 0-1 drink a day. Know how much alcohol is in your drink. In the [place] one drink equals one 12 oz bottle of beer (355 mL), one 5 oz glass of wine (148 mL), or one 1 oz glass of hard liquor (44 mL). Lifestyle Brush your teeth every morning and night with fluoride toothpaste. Floss one time each day. Exercise for at least 30 minutes 5 or more days each week. Do not use any products that contain nicotine  or tobacco. These products include cigarettes, chewing tobacco, and vaping devices, such as e-cigarettes. If you need help quitting, ask your health care provider. Do not use drugs. If you are sexually active, practice safe sex. Use a condom or other form of protection to prevent STIs. If you do not wish to become pregnant, use a form of birth control. If you plan to become pregnant, see your health care provider for a prepregnancy visit. Find healthy ways to manage stress, such as: Meditation, yoga, or listening to music. Journaling. Talking to a trusted  person. Spending time with friends and family. Minimize exposure to UV radiation to reduce your risk of skin cancer. Safety Always wear your seat belt while driving or riding in a vehicle. Do not drive: If you have been drinking alcohol. Do not ride with someone who has been drinking. If you have been using any mind-altering substances or drugs. While texting. When you are tired or distracted. Wear a helmet and other protective equipment during sports activities. If you have firearms in your house, make sure you follow all gun safety procedures. Seek help if you have been physically or sexually abused. What's next? Go to your health care provider once a year for an annual wellness  visit. Ask your health care provider how often you should have your eyes and teeth checked. Stay up to date on all vaccines. This information is not intended to replace advice given to you by your health care provider. Make sure you discuss any questions you have with your health care provider. Document Revised: 08/07/2020 Document Reviewed: 08/07/2020 Elsevier Patient Education  2024 [employer].
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
## 2023-11-24 ENCOUNTER — Other Ambulatory Visit (HOSPITAL_COMMUNITY)
Admission: RE | Admit: 2023-11-24 | Discharge: 2023-11-24 | Disposition: A | Discharge status: Home / Self Care | Source: Ambulatory Visit | Attending: Certified Nurse Midwife | Admitting: Certified Nurse Midwife

## 2023-11-24 ENCOUNTER — Ambulatory Visit (INDEPENDENT_AMBULATORY_CARE_PROVIDER_SITE_OTHER): Admitting: Certified Nurse Midwife

## 2023-11-24 ENCOUNTER — Encounter: Payer: Self-pay | Admitting: Certified Nurse Midwife

## 2023-11-24 VITALS — BP 110/74 | HR 57 | Resp 16 | Ht 60.0 in | Wt 164.5 lb

## 2023-11-24 DIAGNOSIS — Z01419 Encounter for gynecological examination (general) (routine) without abnormal findings: Secondary | ICD-10-CM

## 2023-11-24 DIAGNOSIS — Z30432 Encounter for removal of intrauterine contraceptive device: Secondary | ICD-10-CM

## 2023-11-24 DIAGNOSIS — Z124 Encounter for screening for malignant neoplasm of cervix: Secondary | ICD-10-CM

## 2023-11-24 DIAGNOSIS — Z1329 Encounter for screening for other suspected endocrine disorder: Secondary | ICD-10-CM

## 2023-11-25 LAB — COMPREHENSIVE METABOLIC PANEL WITH GFR
ALT: 13 IU/L (ref 0–32)
AST: 15 IU/L (ref 0–40)
Albumin: 4.5 g/dL (ref 4.0–5.0)
Alkaline Phosphatase: 67 IU/L (ref 41–116)
BUN/Creatinine Ratio: 26 — ABNORMAL HIGH (ref 9–23)
BUN: 19 mg/dL (ref 6–20)
Bilirubin Total: 1.2 mg/dL (ref 0.0–1.2)
CO2: 23 mmol/L (ref 20–29)
Calcium: 10 mg/dL (ref 8.7–10.2)
Chloride: 104 mmol/L (ref 96–106)
Creatinine, Ser: 0.74 mg/dL (ref 0.57–1.00)
Globulin, Total: 2.3 g/dL (ref 1.5–4.5)
Glucose: 73 mg/dL (ref 70–99)
Potassium: 4.2 mmol/L (ref 3.5–5.2)
Sodium: 140 mmol/L (ref 134–144)
Total Protein: 6.8 g/dL (ref 6.0–8.5)
eGFR: 112 mL/min/1.73 (ref >59)

## 2023-11-25 LAB — CBC
Hematocrit: 45.2 % (ref 34.0–46.6)
Hemoglobin: 14.7 g/dL (ref 11.1–15.9)
MCH: 30.8 pg (ref 26.6–33.0)
MCHC: 32.5 g/dL (ref 31.5–35.7)
MCV: 95 fL (ref 79–97)
Platelets: 261 x10E3/uL (ref 150–450)
RBC: 4.77 x10E6/uL (ref 3.77–5.28)
RDW: 11.9 % (ref 11.7–15.4)
WBC: 6.2 x10E3/uL (ref 3.4–10.8)

## 2023-11-25 LAB — TSH: TSH: 2.82 u[IU]/mL (ref 0.450–4.500)

## 2023-11-26 LAB — CYTOLOGY - PAP: Diagnosis: NEGATIVE

## 2023-11-28 ENCOUNTER — Ambulatory Visit: Payer: Self-pay | Admitting: Certified Nurse Midwife
